# Patient Record
Sex: Female | Born: 1935 | Race: White | Hispanic: No | Marital: Married | Smoking: Never smoker
Health system: Southern US, Community
[De-identification: ages and names within clinical notes are randomized; demographics above are authoritative.]

## PROBLEM LIST (undated history)

## (undated) DIAGNOSIS — B019 Varicella without complication: Secondary | ICD-10-CM

## (undated) DIAGNOSIS — M8580 Other specified disorders of bone density and structure, unspecified site: Secondary | ICD-10-CM

## (undated) DIAGNOSIS — C73 Malignant neoplasm of thyroid gland: Secondary | ICD-10-CM

## (undated) DIAGNOSIS — E785 Hyperlipidemia, unspecified: Secondary | ICD-10-CM

## (undated) DIAGNOSIS — E039 Hypothyroidism, unspecified: Secondary | ICD-10-CM

## (undated) DIAGNOSIS — I10 Essential (primary) hypertension: Secondary | ICD-10-CM

## (undated) HISTORY — PX: DILATION AND CURETTAGE OF UTERUS: SHX78

## (undated) HISTORY — PX: KNEE CARTILAGE SURGERY: SHX688

## (undated) HISTORY — PX: HALLUX VALGUS AUSTIN: SHX6623

## (undated) HISTORY — PX: EYE SURGERY: SHX253

## (undated) HISTORY — PX: CARPAL TUNNEL RELEASE: SHX101

## (undated) HISTORY — PX: CATARACT EXTRACTION: SUR2

---

## 2008-04-04 ENCOUNTER — Ambulatory Visit: Payer: Self-pay | Admitting: Internal Medicine

## 2008-04-04 DIAGNOSIS — E782 Mixed hyperlipidemia: Secondary | ICD-10-CM | POA: Insufficient documentation

## 2008-04-04 DIAGNOSIS — E039 Hypothyroidism, unspecified: Secondary | ICD-10-CM | POA: Insufficient documentation

## 2008-04-04 DIAGNOSIS — I10 Essential (primary) hypertension: Secondary | ICD-10-CM | POA: Insufficient documentation

## 2008-04-04 DIAGNOSIS — E785 Hyperlipidemia, unspecified: Secondary | ICD-10-CM

## 2008-04-04 DIAGNOSIS — IMO0002 Reserved for concepts with insufficient information to code with codable children: Secondary | ICD-10-CM | POA: Insufficient documentation

## 2008-04-05 LAB — CONVERTED CEMR LAB
Alkaline Phosphatase: 54 units/L (ref 39–117)
Bilirubin, Direct: 0.2 mg/dL (ref 0.0–0.3)
CO2: 32 meq/L (ref 19–32)
Calcium: 9.3 mg/dL (ref 8.4–10.5)
Cholesterol: 188 mg/dL (ref 0–200)
HDL: 31.5 mg/dL — ABNORMAL LOW (ref >39.0)
Lymphocytes Relative: 26.6 % (ref 12.0–46.0)
MCHC: 34 g/dL (ref 30.0–36.0)
MCV: 99.3 fL (ref 78.0–100.0)
Monocytes Absolute: 0.6 10*3/uL (ref 0.1–1.0)
Neutro Abs: 3.9 10*3/uL (ref 1.4–7.7)
Neutrophils Relative %: 61.3 % (ref 43.0–77.0)
Platelets: 201 10*3/uL (ref 150–400)
Potassium: 4.5 meq/L (ref 3.5–5.1)
RDW: 12.9 % (ref 11.5–14.6)
TSH: 1.93 microintl units/mL (ref 0.35–5.50)
Total Bilirubin: 1 mg/dL (ref 0.3–1.2)
Total CHOL/HDL Ratio: 6
Triglycerides: 255 mg/dL (ref 0–149)
WBC: 6.2 10*3/uL (ref 4.5–10.5)

## 2008-04-11 ENCOUNTER — Ambulatory Visit: Payer: Self-pay | Admitting: Family Medicine

## 2008-04-11 DIAGNOSIS — M25569 Pain in unspecified knee: Secondary | ICD-10-CM

## 2008-04-11 DIAGNOSIS — M1710 Unilateral primary osteoarthritis, unspecified knee: Secondary | ICD-10-CM | POA: Insufficient documentation

## 2008-04-15 ENCOUNTER — Telehealth (INDEPENDENT_AMBULATORY_CARE_PROVIDER_SITE_OTHER): Payer: Self-pay | Admitting: *Deleted

## 2008-04-22 ENCOUNTER — Ambulatory Visit: Payer: Self-pay | Admitting: Family Medicine

## 2008-04-22 DIAGNOSIS — M2390 Unspecified internal derangement of unspecified knee: Secondary | ICD-10-CM | POA: Insufficient documentation

## 2008-04-26 ENCOUNTER — Encounter: Admission: RE | Admit: 2008-04-26 | Discharge: 2008-04-26 | Payer: Self-pay | Admitting: Family Medicine

## 2008-05-01 ENCOUNTER — Encounter: Payer: Self-pay | Admitting: Family Medicine

## 2008-05-29 ENCOUNTER — Encounter: Payer: Self-pay | Admitting: Internal Medicine

## 2008-06-26 ENCOUNTER — Encounter: Payer: Self-pay | Admitting: Family Medicine

## 2008-10-30 ENCOUNTER — Ambulatory Visit: Payer: Self-pay | Admitting: Internal Medicine

## 2008-10-30 DIAGNOSIS — M1990 Unspecified osteoarthritis, unspecified site: Secondary | ICD-10-CM | POA: Insufficient documentation

## 2008-10-30 DIAGNOSIS — R21 Rash and other nonspecific skin eruption: Secondary | ICD-10-CM

## 2008-10-30 DIAGNOSIS — L570 Actinic keratosis: Secondary | ICD-10-CM

## 2008-11-21 ENCOUNTER — Ambulatory Visit: Payer: Self-pay | Admitting: Internal Medicine

## 2008-11-21 DIAGNOSIS — N390 Urinary tract infection, site not specified: Secondary | ICD-10-CM

## 2008-11-21 LAB — CONVERTED CEMR LAB
Bilirubin Urine: NEGATIVE
Glucose, Urine, Semiquant: NEGATIVE
Nitrite: NEGATIVE
Urobilinogen, UA: 0.2

## 2008-11-25 ENCOUNTER — Telehealth: Payer: Self-pay | Admitting: Internal Medicine

## 2009-01-24 ENCOUNTER — Telehealth: Payer: Self-pay | Admitting: Family Medicine

## 2009-05-02 ENCOUNTER — Ambulatory Visit: Payer: Self-pay | Admitting: Internal Medicine

## 2009-05-07 LAB — CONVERTED CEMR LAB
ALT: 20 units/L (ref 0–35)
AST: 24 units/L (ref 0–37)
Albumin: 4 g/dL (ref 3.5–5.2)
BUN: 13 mg/dL (ref 6–23)
Basophils Absolute: 0 10*3/uL (ref 0.0–0.1)
Calcium: 9.4 mg/dL (ref 8.4–10.5)
Chloride: 104 meq/L (ref 96–112)
Cholesterol: 207 mg/dL — ABNORMAL HIGH (ref 0–200)
Creatinine, Ser: 0.9 mg/dL (ref 0.4–1.2)
HCT: 43.7 % (ref 36.0–46.0)
Hemoglobin: 14.8 g/dL (ref 12.0–15.0)
Lymphocytes Relative: 29 % (ref 12.0–46.0)
MCV: 100.7 fL — ABNORMAL HIGH (ref 78.0–100.0)
Monocytes Relative: 11.2 % (ref 3.0–12.0)
Neutro Abs: 3.4 10*3/uL (ref 1.4–7.7)
Neutrophils Relative %: 58.6 % (ref 43.0–77.0)
Phosphorus: 3.8 mg/dL (ref 2.3–4.6)
Platelets: 169 10*3/uL (ref 150.0–400.0)
Potassium: 4.4 meq/L (ref 3.5–5.1)
RBC: 4.34 M/uL (ref 3.87–5.11)
Sodium: 138 meq/L (ref 135–145)
Total Bilirubin: 1.2 mg/dL (ref 0.3–1.2)
Total Protein: 6.4 g/dL (ref 6.0–8.3)
Triglycerides: 139 mg/dL (ref 0.0–149.0)
WBC: 5.8 10*3/uL (ref 4.5–10.5)

## 2012-01-17 ENCOUNTER — Ambulatory Visit: Payer: Self-pay | Admitting: Anesthesiology

## 2012-01-20 ENCOUNTER — Ambulatory Visit: Payer: Self-pay | Admitting: Podiatry

## 2013-11-15 ENCOUNTER — Ambulatory Visit: Payer: Self-pay | Admitting: Family Medicine

## 2014-03-25 DIAGNOSIS — M25562 Pain in left knee: Secondary | ICD-10-CM | POA: Insufficient documentation

## 2015-05-19 ENCOUNTER — Encounter
Admission: RE | Admit: 2015-05-19 | Discharge: 2015-05-19 | Disposition: A | Discharge status: Home / Self Care | Payer: Medicare Other | Source: Ambulatory Visit | Attending: Surgery | Admitting: Surgery

## 2015-05-19 ENCOUNTER — Other Ambulatory Visit: Payer: Self-pay

## 2015-05-19 DIAGNOSIS — Z01818 Encounter for other preprocedural examination: Secondary | ICD-10-CM | POA: Insufficient documentation

## 2015-05-19 DIAGNOSIS — K429 Umbilical hernia without obstruction or gangrene: Secondary | ICD-10-CM | POA: Diagnosis not present

## 2015-05-19 HISTORY — DX: Varicella without complication: B01.9

## 2015-05-19 HISTORY — DX: Hyperlipidemia, unspecified: E78.5

## 2015-05-19 HISTORY — DX: Hypothyroidism, unspecified: E03.9

## 2015-05-19 HISTORY — DX: Essential (primary) hypertension: I10

## 2015-05-19 HISTORY — DX: Other specified disorders of bone density and structure, unspecified site: M85.80

## 2015-05-19 LAB — CBC
HEMATOCRIT: 43.5 % (ref 35.0–47.0)
Hemoglobin: 14.4 g/dL (ref 12.0–16.0)
MCH: 32.4 pg (ref 26.0–34.0)
MCHC: 33 g/dL (ref 32.0–36.0)
MCV: 98.3 fL (ref 80.0–100.0)
PLATELETS: 174 10*3/uL (ref 150–440)
RBC: 4.43 MIL/uL (ref 3.80–5.20)
RDW: 13.8 % (ref 11.5–14.5)
WBC: 5.5 10*3/uL (ref 3.6–11.0)

## 2015-05-19 NOTE — Patient Instructions (Signed)
  Your procedure is scheduled on: Friday 05/23/15 Report to Day Surgery. 2ND FLOOR MEDICAL MALL ENTRANCE To find out your arrival time please call [phone] between 1PM - 3PM on Thursday 05/22/15.  Remember: Instructions that are not followed completely may result in serious medical risk, up to and including death, or upon the discretion of your surgeon and anesthesiologist your surgery may need to be rescheduled.    __X__ 1. Do not eat food or drink liquids after midnight. No gum chewing or hard candies.     __X__ 2. No Alcohol for 24 hours before or after surgery.   ____ 3. Bring all medications with you on the day of surgery if instructed.    __X__ 4. Notify your doctor if there is any change in your medical condition     (cold, fever, infections).     Do not wear jewelry, make-up, hairpins, clips or nail polish.  Do not wear lotions, powders, or perfumes.   Do not shave 48 hours prior to surgery. Men may shave face and neck.  Do not bring valuables to the hospital.    [facility] is not responsible for any belongings or valuables.               Contacts, dentures or bridgework may not be worn into surgery.  Leave your suitcase in the car. After surgery it may be brought to your room.  For patients admitted to the hospital, discharge time is determined by your                treatment team.   Patients discharged the day of surgery will not be allowed to drive home.   Please read over the following fact sheets that you were given:   Surgical Site Infection Prevention   __X__ Take these medicines the morning of surgery with A SIP OF WATER:    1. LEVOTHYROXINE  2.   3.   4.  5.  6.  ____ Fleet Enema (as directed)   __X__ Use CHG Soap as directed  ____ Use inhalers on the day of surgery  ____ Stop metformin 2 days prior to surgery    ____ Take 1/2 of usual insulin dose the night before surgery and none on the morning of surgery.   ____ Stop Coumadin/Plavix/aspirin  on   __X__ Stop Anti-inflammatories on  TODAY   ____ Stop supplements until after surgery.    ____ Bring C-Pap to the hospital.

## 2015-05-20 NOTE — OR Nursing (Signed)
Spoke to Dr Kayleen Memos about ekg

## 2015-05-23 ENCOUNTER — Encounter
Admission: RE | Disposition: A | Discharge status: Home / Self Care | Payer: Self-pay | Source: Ambulatory Visit | Attending: Surgery

## 2015-05-23 ENCOUNTER — Ambulatory Visit
Admission: RE | Admit: 2015-05-23 | Discharge: 2015-05-23 | Disposition: A | Discharge status: Home / Self Care | Payer: Medicare Other | Source: Ambulatory Visit | Attending: Surgery | Admitting: Surgery

## 2015-05-23 ENCOUNTER — Ambulatory Visit: Payer: Medicare Other | Admitting: Certified Registered Nurse Anesthetist

## 2015-05-23 ENCOUNTER — Encounter: Payer: Self-pay | Admitting: *Deleted

## 2015-05-23 DIAGNOSIS — E039 Hypothyroidism, unspecified: Secondary | ICD-10-CM | POA: Insufficient documentation

## 2015-05-23 DIAGNOSIS — K429 Umbilical hernia without obstruction or gangrene: Secondary | ICD-10-CM | POA: Insufficient documentation

## 2015-05-23 DIAGNOSIS — Z9841 Cataract extraction status, right eye: Secondary | ICD-10-CM | POA: Diagnosis not present

## 2015-05-23 DIAGNOSIS — M1990 Unspecified osteoarthritis, unspecified site: Secondary | ICD-10-CM | POA: Diagnosis not present

## 2015-05-23 DIAGNOSIS — E785 Hyperlipidemia, unspecified: Secondary | ICD-10-CM | POA: Insufficient documentation

## 2015-05-23 DIAGNOSIS — Z79899 Other long term (current) drug therapy: Secondary | ICD-10-CM | POA: Diagnosis not present

## 2015-05-23 DIAGNOSIS — Z9842 Cataract extraction status, left eye: Secondary | ICD-10-CM | POA: Insufficient documentation

## 2015-05-23 DIAGNOSIS — M8580 Other specified disorders of bone density and structure, unspecified site: Secondary | ICD-10-CM | POA: Insufficient documentation

## 2015-05-23 DIAGNOSIS — I440 Atrioventricular block, first degree: Secondary | ICD-10-CM | POA: Insufficient documentation

## 2015-05-23 DIAGNOSIS — R0602 Shortness of breath: Secondary | ICD-10-CM | POA: Insufficient documentation

## 2015-05-23 DIAGNOSIS — I10 Essential (primary) hypertension: Secondary | ICD-10-CM | POA: Diagnosis not present

## 2015-05-23 HISTORY — PX: UMBILICAL HERNIA REPAIR: SHX196

## 2015-05-23 SURGERY — REPAIR, HERNIA, UMBILICAL, ADULT
Anesthesia: General

## 2015-05-23 MED ORDER — LIDOCAINE HCL (CARDIAC) 20 MG/ML IV SOLN
INTRAVENOUS | Status: DC | PRN
  Administered 2015-05-23: 100 mg via INTRAVENOUS

## 2015-05-23 MED ORDER — LACTATED RINGERS IV SOLN
INTRAVENOUS | Status: DC
  Administered 2015-05-23 (×2): via INTRAVENOUS

## 2015-05-23 MED ORDER — FAMOTIDINE 20 MG PO TABS
20.0000 mg | ORAL_TABLET | Freq: Once | ORAL | Status: AC
  Administered 2015-05-23: 20 mg via ORAL

## 2015-05-23 MED ORDER — SUCCINYLCHOLINE CHLORIDE 20 MG/ML IJ SOLN
INTRAMUSCULAR | Status: DC | PRN
  Administered 2015-05-23: 140 mg via INTRAVENOUS

## 2015-05-23 MED ORDER — GLYCOPYRROLATE 0.2 MG/ML IJ SOLN
INTRAMUSCULAR | Status: DC | PRN
  Administered 2015-05-23: 0.4 mg via INTRAVENOUS

## 2015-05-23 MED ORDER — ROCURONIUM BROMIDE 100 MG/10ML IV SOLN
INTRAVENOUS | Status: DC | PRN
  Administered 2015-05-23: 15 mg via INTRAVENOUS
  Administered 2015-05-23: 5 mg via INTRAVENOUS

## 2015-05-23 MED ORDER — HYDROCODONE-ACETAMINOPHEN 5-325 MG PO TABS: 1.0000 | ORAL_TABLET | ORAL | Status: DC | PRN

## 2015-05-23 MED ORDER — ACETAMINOPHEN 10 MG/ML IV SOLN
INTRAVENOUS | Status: AC
  Filled 2015-05-23: qty 100

## 2015-05-23 MED ORDER — NEOSTIGMINE METHYLSULFATE 10 MG/10ML IV SOLN
INTRAVENOUS | Status: DC | PRN
  Administered 2015-05-23: 3 mg via INTRAVENOUS

## 2015-05-23 MED ORDER — MIDAZOLAM HCL 2 MG/2ML IJ SOLN
INTRAMUSCULAR | Status: DC | PRN
  Administered 2015-05-23 (×2): 1 mg via INTRAVENOUS

## 2015-05-23 MED ORDER — PROPOFOL 10 MG/ML IV BOLUS
INTRAVENOUS | Status: DC | PRN
  Administered 2015-05-23: 150 mg via INTRAVENOUS

## 2015-05-23 MED ORDER — BUPIVACAINE-EPINEPHRINE (PF) 0.5% -1:200000 IJ SOLN
INTRAMUSCULAR | Status: AC
  Filled 2015-05-23: qty 30

## 2015-05-23 MED ORDER — EPHEDRINE SULFATE 50 MG/ML IJ SOLN
INTRAMUSCULAR | Status: DC | PRN
  Administered 2015-05-23: 10 mg via INTRAVENOUS

## 2015-05-23 MED ORDER — CEFAZOLIN SODIUM-DEXTROSE 2-3 GM-% IV SOLR: 2.0000 g | Freq: Once | INTRAVENOUS | Status: DC

## 2015-05-23 MED ORDER — FENTANYL CITRATE (PF) 100 MCG/2ML IJ SOLN
INTRAMUSCULAR | Status: DC | PRN
  Administered 2015-05-23 (×2): 50 ug via INTRAVENOUS

## 2015-05-23 MED ORDER — ACETAMINOPHEN 10 MG/ML IV SOLN
INTRAVENOUS | Status: DC | PRN
  Administered 2015-05-23: 1000 mg via INTRAVENOUS

## 2015-05-23 MED ORDER — CEFAZOLIN SODIUM-DEXTROSE 2-3 GM-% IV SOLR
INTRAVENOUS | Status: AC
  Filled 2015-05-23: qty 50

## 2015-05-23 MED ORDER — FAMOTIDINE 20 MG PO TABS
ORAL_TABLET | ORAL | Status: AC
  Administered 2015-05-23: 20 mg via ORAL
  Filled 2015-05-23: qty 1

## 2015-05-23 MED ORDER — KETOROLAC TROMETHAMINE 30 MG/ML IJ SOLN
INTRAMUSCULAR | Status: DC | PRN
  Administered 2015-05-23: 15 mg via INTRAVENOUS

## 2015-05-23 MED ORDER — BUPIVACAINE-EPINEPHRINE (PF) 0.5% -1:200000 IJ SOLN
INTRAMUSCULAR | Status: DC | PRN
  Administered 2015-05-23: 10 mL

## 2015-05-23 SURGICAL SUPPLY — 23 items
BLADE SURG 15 STRL LF DISP TIS (BLADE) ×1 IMPLANT
BLADE SURG 15 STRL SS (BLADE) ×2
CANISTER SUCT 1200ML W/VALVE (MISCELLANEOUS) ×3 IMPLANT
CHLORAPREP W/TINT 26ML (MISCELLANEOUS) ×3 IMPLANT
DRAPE PED LAPAROTOMY (DRAPES) ×3 IMPLANT
GLOVE BIO SURGEON STRL SZ7.5 (GLOVE) ×3 IMPLANT
GOWN STRL REUS W/ TWL LRG LVL3 (GOWN DISPOSABLE) ×3 IMPLANT
GOWN STRL REUS W/TWL LRG LVL3 (GOWN DISPOSABLE) ×6
KIT RM TURNOVER STRD PROC AR (KITS) ×3 IMPLANT
LABEL OR SOLS (LABEL) ×3 IMPLANT
LIQUID BAND (GAUZE/BANDAGES/DRESSINGS) ×3 IMPLANT
MESH SYNTHETIC 4X6 SOFT BARD (Mesh General) ×1 IMPLANT
MESH SYNTHETIC SOFT BARD 4X6 (Mesh General) ×2 IMPLANT
NEEDLE HYPO 25X1 1.5 SAFETY (NEEDLE) ×3 IMPLANT
NS IRRIG 500ML POUR BTL (IV SOLUTION) ×3 IMPLANT
PACK BASIN MINOR ARMC (MISCELLANEOUS) ×3 IMPLANT
PAD GROUND ADULT SPLIT (MISCELLANEOUS) ×3 IMPLANT
SUT CHROMIC 3 0 SH 27 (SUTURE) ×3 IMPLANT
SUT CHROMIC 4 0 RB 1X27 (SUTURE) ×3 IMPLANT
SUT MNCRL+ 5-0 UNDYED PC-3 (SUTURE) ×1 IMPLANT
SUT MONOCRYL 5-0 (SUTURE) ×2
SUT SURGILON 0 30 BLK (SUTURE) ×6 IMPLANT
SYRINGE 10CC LL (SYRINGE) ×3 IMPLANT

## 2015-05-23 NOTE — H&P (Signed)
  She reports no change in condition since office visit.   Discussed plan for umbilical hernia repair.

## 2015-05-23 NOTE — Anesthesia Preprocedure Evaluation (Signed)
Anesthesia Evaluation  Patient identified by MRN, date of birth, ID band Patient awake    Reviewed: Allergy & Precautions, H&P , NPO status , Patient's Chart, lab work & pertinent test results  History of Anesthesia Complications Negative for: history of anesthetic complications  Airway Mallampati: III  TM Distance: <3 FB Neck ROM: limited    Dental  (+) Poor Dentition, Chipped   Pulmonary neg pulmonary ROS, neg shortness of breath,    Pulmonary exam normal breath sounds clear to auscultation       Cardiovascular Exercise Tolerance: Good hypertension, (-) angina(-) Past MI and (-) DOE Normal cardiovascular exam Rhythm:regular Rate:Normal     Neuro/Psych negative neurological ROS  negative psych ROS   GI/Hepatic negative GI ROS, Neg liver ROS,   Endo/Other  Hypothyroidism   Renal/GU negative Renal ROS  negative genitourinary   Musculoskeletal  (+) Arthritis ,   Abdominal   Peds  Hematology negative hematology ROS (+)   Anesthesia Other Findings Past Medical History:   Hypertension                                                 Chicken pox                                                  Hyperlipidemia                                               Hypothyroidism                                               Osteopenia                                                  Past Surgical History:   EYE SURGERY                                                   CATARACT EXTRACTION                             Bilateral              CARPAL TUNNEL RELEASE                           Right              HALLUX VALGUS [place]  KNEE CARTILAGE SURGERY                          Right              DILATION AND CURETTAGE OF UTERUS                                Comment:for post meopausal bleeding  BMI    Body Mass Index   32.70 kg/m 2      Reproductive/Obstetrics negative OB ROS                              Anesthesia Physical Anesthesia Plan  ASA: III  Anesthesia Plan: General ETT   Post-op Pain Management:    Induction:   Airway Management Planned:   Additional Equipment:   Intra-op Plan:   Post-operative Plan:   Informed Consent: I have reviewed the patients History and Physical, chart, labs and discussed the procedure including the risks, benefits and alternatives for the proposed anesthesia with the patient or authorized representative who has indicated his/her understanding and acceptance.   Dental Advisory Given  Plan Discussed with: Anesthesiologist, CRNA and Surgeon  Anesthesia Plan Comments:         Anesthesia Quick Evaluation

## 2015-05-23 NOTE — Transfer of Care (Signed)
Immediate Anesthesia Transfer of Care Note  Patient: Yolanda Wade  Procedure(s) Performed: Procedure(s): HERNIA REPAIR UMBILICAL ADULT (N/A)  Patient Location: PACU  Anesthesia Type:General  Level of Consciousness: awake and alert   Airway & Oxygen Therapy: Patient Spontanous Breathing and Patient connected to nasal cannula oxygen  Post-op Assessment: Report given to RN and Post -op Vital signs reviewed and stable  Post vital signs: Reviewed and stable  Last Vitals:  Filed Vitals:   05/23/15 1148  BP: 152/93  Pulse: 80  Temp: 36.7 C  Resp: 18    Complications: No apparent anesthesia complications

## 2015-05-23 NOTE — Anesthesia Postprocedure Evaluation (Signed)
  Anesthesia Post-op Note  Patient: Yolanda Wade  Procedure(s) Performed: Procedure(s): HERNIA REPAIR UMBILICAL ADULT (N/A)  Anesthesia type:General ETT  Patient location: PACU  Post pain: Pain level controlled  Post assessment: Post-op Vital signs reviewed, Patient's Cardiovascular Status Stable, Respiratory Function Stable, Patent Airway and No signs of Nausea or vomiting  Post vital signs: Reviewed and stable  Last Vitals:  Filed Vitals:   05/23/15 1524  BP: 155/71  Pulse: 57  Temp:   Resp: 20    Level of consciousness: awake, alert  and patient cooperative  Complications: No apparent anesthesia complications

## 2015-05-23 NOTE — Discharge Instructions (Signed)
Take Tylenol or Norco if needed for pain.  May shower.  Avoid straining and heavy lifting for 1 month.

## 2015-05-23 NOTE — Op Note (Signed)
OPERATIVE REPORT  PREOPERATIVE  DIAGNOSIS: . Umbilical hernia  POSTOPERATIVE DIAGNOSIS: . Umbilical hernia  PROCEDURE: . Umbilical hernia repair  ANESTHESIA:  General  SURGEON: Rochel Brome  MD   INDICATIONS: . She reported an enlarging umbilical hernia. Umbilical hernia was demonstrated on physical exam the prostate 5 cm in dimension and appeared to be large enough to admit small bowel. Repair is recommended for definitive treatment.  With the patient on the operating table in the supine position the abdomen was prepared with ChloraPrep and draped in a sterile manner. An infraumbilical transversely oriented curvilinear 3 cm incision was made and carried down through subcutaneous tissues. Several small bleeding points are cauterized. There was an umbilical hernia sac which was dissected free from surrounding structures and was approximately 5 cm in length. A high ligation of the sac was done with the 4-0 chromic suture ligature and the sac was excised and was not submitted for pathology. The properitoneal fat was dissected away from the fascia circumferentially. A Bard soft mesh was cut out to create an oval shape of 3 x 4 cm which was placed transversely oriented into this properitoneal plane. It was sutured to the overlying fascia with 0 Surgilon sutures. The fascial defect was closed with a transversely oriented suture line of interrupted 0 Surgilon figure-of-eight sutures incorporating each suture into the mesh. The repair looked good. Hemostasis was intact. The deep fascia and subcutaneous tissues were infiltrated with half percent Sensorcaine with epinephrine. The skin of the umbilicus was sutured to the deep fascia with 4-0 chromic. The skin was closed with running 5-0 Monocryl subcuticular suture and LiquiBand. The patient tolerated surgery satisfactorily and was prepared for transfer to the recovery room  [facility].D.

## 2015-05-23 NOTE — Anesthesia Procedure Notes (Signed)
Procedure Name: Intubation Date/Time: 05/23/2015 12:41 PM Performed by: Johnna Acosta Pre-anesthesia Checklist: Patient identified, Emergency Drugs available, Suction available and Patient being monitored Patient Re-evaluated:Patient Re-evaluated prior to inductionOxygen Delivery Method: Circle system utilized Preoxygenation: Pre-oxygenation with 100% oxygen Intubation Type: IV induction Ventilation: Oral airway inserted - appropriate to patient size and Mask ventilation without difficulty Laryngoscope Size: Glidescope and 3 Grade View: Grade II Tube type: Oral Tube size: 7.0 mm Number of attempts: 1

## 2015-05-26 ENCOUNTER — Encounter: Payer: Self-pay | Admitting: Surgery

## 2016-05-21 ENCOUNTER — Emergency Department
Admission: EM | Admit: 2016-05-21 | Discharge: 2016-05-21 | Disposition: A | Discharge status: Home / Self Care | Payer: Medicare Other | Attending: Emergency Medicine | Admitting: Emergency Medicine

## 2016-05-21 ENCOUNTER — Emergency Department: Payer: Medicare Other

## 2016-05-21 ENCOUNTER — Encounter: Payer: Self-pay | Admitting: Radiology

## 2016-05-21 DIAGNOSIS — Z791 Long term (current) use of non-steroidal anti-inflammatories (NSAID): Secondary | ICD-10-CM | POA: Diagnosis not present

## 2016-05-21 DIAGNOSIS — M545 Low back pain: Secondary | ICD-10-CM | POA: Insufficient documentation

## 2016-05-21 DIAGNOSIS — Y929 Unspecified place or not applicable: Secondary | ICD-10-CM | POA: Diagnosis not present

## 2016-05-21 DIAGNOSIS — W19XXXA Unspecified fall, initial encounter: Secondary | ICD-10-CM | POA: Diagnosis not present

## 2016-05-21 DIAGNOSIS — Y999 Unspecified external cause status: Secondary | ICD-10-CM | POA: Diagnosis not present

## 2016-05-21 DIAGNOSIS — K5903 Drug induced constipation: Secondary | ICD-10-CM | POA: Diagnosis not present

## 2016-05-21 DIAGNOSIS — Z79899 Other long term (current) drug therapy: Secondary | ICD-10-CM | POA: Insufficient documentation

## 2016-05-21 DIAGNOSIS — Y939 Activity, unspecified: Secondary | ICD-10-CM | POA: Insufficient documentation

## 2016-05-21 DIAGNOSIS — E039 Hypothyroidism, unspecified: Secondary | ICD-10-CM | POA: Insufficient documentation

## 2016-05-21 DIAGNOSIS — T402X5A Adverse effect of other opioids, initial encounter: Secondary | ICD-10-CM

## 2016-05-21 DIAGNOSIS — I10 Essential (primary) hypertension: Secondary | ICD-10-CM | POA: Insufficient documentation

## 2016-05-21 DIAGNOSIS — K5900 Constipation, unspecified: Secondary | ICD-10-CM | POA: Diagnosis present

## 2016-05-21 LAB — CBC WITH DIFFERENTIAL/PLATELET
BASOS ABS: 0 10*3/uL (ref 0–0.1)
BASOS PCT: 0 %
EOS ABS: 0 10*3/uL (ref 0–0.7)
EOS PCT: 1 %
HCT: 42.7 % (ref 35.0–47.0)
Hemoglobin: 14.9 g/dL (ref 12.0–16.0)
Lymphocytes Relative: 15 %
Lymphs Abs: 1.2 10*3/uL (ref 1.0–3.6)
MCH: 33.6 pg (ref 26.0–34.0)
MCHC: 34.9 g/dL (ref 32.0–36.0)
MCV: 96.3 fL (ref 80.0–100.0)
Monocytes Absolute: 0.8 10*3/uL (ref 0.2–0.9)
Monocytes Relative: 10 %
NEUTROS PCT: 74 %
Neutro Abs: 5.8 10*3/uL (ref 1.4–6.5)
PLATELETS: 193 10*3/uL (ref 150–440)
RBC: 4.43 MIL/uL (ref 3.80–5.20)
RDW: 13.7 % (ref 11.5–14.5)
WBC: 7.9 10*3/uL (ref 3.6–11.0)

## 2016-05-21 LAB — COMPREHENSIVE METABOLIC PANEL
ALBUMIN: 4.1 g/dL (ref 3.5–5.0)
ALT: 16 U/L (ref 14–54)
AST: 22 U/L (ref 15–41)
Alkaline Phosphatase: 76 U/L (ref 38–126)
Anion gap: 8 (ref 5–15)
BUN: 16 mg/dL (ref 6–20)
CHLORIDE: 97 mmol/L — AB (ref 101–111)
CO2: 27 mmol/L (ref 22–32)
CREATININE: 0.71 mg/dL (ref 0.44–1.00)
Calcium: 8.9 mg/dL (ref 8.9–10.3)
GFR calc non Af Amer: >60 mL/min (ref >60)
GLUCOSE: 100 mg/dL — AB (ref 65–99)
Potassium: 3.8 mmol/L (ref 3.5–5.1)
SODIUM: 132 mmol/L — AB (ref 135–145)
Total Bilirubin: 0.7 mg/dL (ref 0.3–1.2)
Total Protein: 7.3 g/dL (ref 6.5–8.1)

## 2016-05-21 LAB — LIPASE, BLOOD: Lipase: 27 U/L (ref 11–51)

## 2016-05-21 MED ORDER — ACETAMINOPHEN 500 MG PO TABS
1000.0000 mg | ORAL_TABLET | Freq: Once | ORAL | Status: AC
  Administered 2016-05-21: 1000 mg via ORAL
  Filled 2016-05-21: qty 2

## 2016-05-21 MED ORDER — MAGNESIUM CITRATE PO SOLN
1.0000 | Freq: Once | ORAL | Status: AC
  Administered 2016-05-21: 1 via ORAL
  Filled 2016-05-21: qty 296

## 2016-05-21 MED ORDER — LORAZEPAM 2 MG/ML IJ SOLN
0.5000 mg | Freq: Once | INTRAMUSCULAR | Status: AC
  Administered 2016-05-21: 0.5 mg via INTRAVENOUS
  Filled 2016-05-21: qty 1

## 2016-05-21 MED ORDER — MAGNESIUM CITRATE PO SOLN
0.5000 | ORAL | 0 refills | Status: DC | PRN
Start: 2016-05-21 — End: 2016-05-31

## 2016-05-21 MED ORDER — IOPAMIDOL (ISOVUE-300) INJECTION 61%
100.0000 mL | Freq: Once | INTRAVENOUS | Status: AC | PRN
  Administered 2016-05-21: 100 mL via INTRAVENOUS

## 2016-05-21 MED ORDER — IOPAMIDOL (ISOVUE-300) INJECTION 61%
30.0000 mL | Freq: Once | INTRAVENOUS | Status: AC | PRN
  Administered 2016-05-21: 30 mL via ORAL

## 2016-05-21 MED ORDER — POLYETHYLENE GLYCOL 3350 17 GM/SCOOP PO POWD: 1.0000 | Freq: Once | ORAL | 0 refills | Status: AC

## 2016-05-21 NOTE — ED Provider Notes (Signed)
[facility] Emergency Department Provider Note    ____________________________________________   I have reviewed the triage vital signs and the nursing notes.   HISTORY  Chief Complaint Constipation   History limited by: Not Limited   HPI Yolanda Wade is a 82 y.o. female who presents to the emergency department today because of concern for constipation and back pain. It started roughly 2 weeks ago. The patient states she had a fall at that time and started having back pain. Saw PCP who put her on pain medication. Since that time she has not had a good bowel movement, only had a small amount roughly 1 week ago. Has been taking miralax and fiber without any relief. Patient denies any pressure or pain in the rectum. Denies the sensation that she needs to defecate. Denies any fever. Had some vomiting yesterday.   Past Medical History:  Diagnosis Date  . Chicken pox   . Hyperlipidemia   . Hypertension   . Hypothyroidism   . Osteopenia     Patient Active Problem List   Diagnosis Date Noted  . UTI 11/21/2008  . ACTINIC KERATOSIS 10/30/2008  . OSTEOARTHRITIS 10/30/2008  . RASH AND OTHER NONSPECIFIC SKIN ERUPTION 10/30/2008  . INTERNAL DERANGEMENT, RIGHT KNEE 04/22/2008  . 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 KNEE PAIN, RIGHT, ACUTE 04/11/2008  . HYPOTHYROIDISM 04/04/2008  . HYPERLIPIDEMIA 04/04/2008  . HYPERTENSION 04/04/2008  . KNEE SPRAIN 04/04/2008    Past Surgical History:  Procedure Laterality Date  . CARPAL TUNNEL RELEASE Right   . CATARACT EXTRACTION Bilateral   . DILATION AND CURETTAGE OF UTERUS     for post meopausal bleeding  . EYE SURGERY    . HALLUX [place] Right   . UMBILICAL HERNIA REPAIR N/A 05/23/2015   Procedure: HERNIA REPAIR UMBILICAL ADULT;  Surgeon: Leonie Green, MD;  Location: [facility] ORS;  Service: General;  Laterality: N/A;    Prior to Admission medications    Medication Sig Start Date End Date Taking? Authorizing Provider  atorvastatin (LIPITOR) 20 MG tablet Take 20 mg by mouth daily.    Historical Provider, MD  Calcium-Vitamin D-Vitamin K (VIACTIV PO) Take 2 Doses by mouth daily.    Historical Provider, MD  HYDROcodone-acetaminophen (NORCO) 5-325 MG tablet Take 1-2 tablets by mouth every 4 (four) hours as needed for moderate pain. 05/23/15   Leonie Green, MD  levothyroxine (SYNTHROID, LEVOTHROID) 75 MCG tablet Take 75 mcg by mouth daily before breakfast.    Historical Provider, MD  lisinopril-hydrochlorothiazide (PRINZIDE,ZESTORETIC) 10-12.5 MG tablet Take 1 tablet by mouth daily.    Historical Provider, MD  Multiple Vitamins-Minerals (PRESERVISION AREDS PO) Take 1 tablet by mouth 2 (two) times daily.    Historical Provider, MD    Allergies Review of patient's allergies indicates no known allergies.  No family history on file.  Social History Social History  Substance Use Topics  . Smoking status: Never Smoker  . Smokeless tobacco: Never Used  . Alcohol use Yes     Comment: occasionally    Review of Systems  Constitutional: Negative for fever. Cardiovascular: Negative for chest pain. Respiratory: Negative for shortness of breath. Gastrointestinal: Positive for distention, vomiting. Genitourinary: Negative for dysuria. Musculoskeletal: Positive for low back pain. Skin: Negative for rash. Neurological: Negative for headaches, focal weakness or numbness.  10-point ROS otherwise negative.  ____________________________________________   PHYSICAL EXAM:  VITAL SIGNS: ED Triage Vitals  Enc Vitals Group  BP 05/21/16 0512 (!) 182/86     Pulse Rate 05/21/16 0512 76     Resp 05/21/16 0512 20     Temp 05/21/16 0512 98.6 F (37 C)     Temp Source 05/21/16 0512 Oral     SpO2 05/21/16 0512 96 %     Weight 05/21/16 0514 173 lb (78.5 kg)     Height 05/21/16 0514 5\' 1"  (1.549 m)     Head Circumference --      Peak Flow --       Pain Score 05/21/16 0514 6   Constitutional: Alert and oriented. Well appearing and in no distress. Eyes: Conjunctivae are normal. Normal extraocular movements. ENT   Head: Normocephalic and atraumatic.   Nose: No congestion/rhinnorhea.   Mouth/Throat: Mucous membranes are moist.   Neck: No stridor. Hematological/Lymphatic/Immunilogical: No cervical lymphadenopathy. Cardiovascular: Normal rate, regular rhythm.  No murmurs, rubs, or gallops.  Respiratory: Normal respiratory effort without tachypnea nor retractions. Breath sounds are clear and equal bilaterally. No wheezes/rales/rhonchi. Gastrointestinal: Soft. Tender to palpation somewhat diffusely. Distended abdomen. Tympanitic.  Genitourinary: Deferred Musculoskeletal: Normal range of motion in all extremities. No lower extremity edema. Neurologic:  Normal speech and language. No gross focal neurologic deficits are appreciated.  Skin:  Skin is warm, dry and intact. No rash noted. Psychiatric: Mood and affect are normal. Speech and behavior are normal. Patient exhibits appropriate insight and judgment.  ____________________________________________    LABS (pertinent positives/negatives)  Labs Reviewed  COMPREHENSIVE METABOLIC PANEL - Abnormal; Notable for the following:       Result Value   Sodium 132 (*)    Chloride 97 (*)    Glucose, Bld 100 (*)    All other components within normal limits  CBC WITH DIFFERENTIAL/PLATELET  LIPASE, BLOOD  URINALYSIS COMPLETEWITH MICROSCOPIC ([facility] ONLY)   UA pending  ____________________________________________   EKG  None  ____________________________________________    RADIOLOGY  CT abd/pel pending  ____________________________________________   PROCEDURES  Procedures  ____________________________________________   INITIAL IMPRESSION / ASSESSMENT AND PLAN / ED COURSE  Pertinent labs & imaging results that were available during my care of the patient  were reviewed by me and considered in my medical decision making (see chart for details).  Patient with primary concern for constipation. On exam patient's abdominal exam is concerning for some distention and tympany. Will plan on CT abd/pel to evaluate for possible obstruction. ____________________________________________   FINAL CLINICAL IMPRESSION(S) / ED DIAGNOSES  Back pain Decreased bowel movement  Note: This dictation was prepared with Dragon dictation. Any transcriptional errors that result from this process are unintentional    Nance Pear, MD 05/21/16 [phone]

## 2016-05-21 NOTE — ED Notes (Signed)
Patient given water

## 2016-05-21 NOTE — ED Provider Notes (Signed)
Patient received in signout from Dr. Archie Balboa. Patient presenting with constipation and back pain. Has been taking narcotics for the past several weeks and not moving her bowels. CT imaging pending to evaluate for any evidence of obstruction shows large stool burden without evidence of obstructive process. Does have evidence of chronic compression fractures. Patient without any focal neurodeficits. Does not have any evidence of fecal impaction. Symptoms likely secondary to narcotic abuse and slow transit time. Patient currently taking MiraLax. Will add on mag citrate and increase MiraLAX.   Have discussed with the patient and available family all diagnostics and treatments performed thus far and all questions were answered to the best of my ability. The patient demonstrates understanding and agreement with plan.    Merlyn Lot, MD 05/21/16 [phone]

## 2016-05-21 NOTE — ED Notes (Signed)
2 unsuccessful PIV attempts by this RN (left hand and left forearm).

## 2016-05-21 NOTE — ED Triage Notes (Signed)
Patient presents to the ED tonight with c/o constipation. LNBM was over 2 weeks ago. Patient reports that she has been seen by PCP and [facility]; is currently taking Miralax. Patient states, "It is a shame that you go for help and no on wants to help you. Everyone wants to take the slow road to fixing this". Patient reports that she has been taking pain medication for sciatica.

## 2016-05-21 NOTE — Discharge Instructions (Signed)
CLINICAL DATA:  Constipation for 2 weeks. Abdominal distention and back pain.   EXAM: CT ABDOMEN AND PELVIS WITH CONTRAST   TECHNIQUE: Multidetector CT imaging of the abdomen and pelvis was performed using the standard protocol following bolus administration of intravenous contrast.   CONTRAST:  160mL ISOVUE-300 IOPAMIDOL (ISOVUE-300) INJECTION 61%   COMPARISON:  None.   FINDINGS: Lower chest:  No contributory findings.   Hepatobiliary: No focal liver abnormality.No evidence of biliary obstruction or stone.   Pancreas: Unremarkable.   Spleen: Unremarkable.   Adrenals/Urinary Tract: Negative adrenals. No hydronephrosis or stone. Renal cysts. Unremarkable bladder.   Stomach/Bowel: Formed stool throughout the colon. No rectal impaction or obstruction. Mild colonic diverticulosis. No pericecal inflammation.   Vascular/Lymphatic: No acute vascular abnormality. No mass or adenopathy.   Reproductive:11 cm uterine mass that is heterogeneously enhancing. Negative ovaries.   Other: No ascites or pneumoperitoneum.   Musculoskeletal: No acute abnormalities. T12 compression fracture with mild height loss. Fracture lucency is partly visible, overall subacute to chronic appearance. Remote L1 compression fracture with moderate height loss. Advanced facet arthropathy in the lower lumbar spine with grade 1 L5-S1 anterolisthesis.   IMPRESSION: 1. Large volume stool without obstruction or impaction. 2. 11 cm uterine mass has features of fibroid, but is unexpected large and enhancing for patient's age. Reportedly there is history of biopsy for postmenopausal bleeding. Recommend gynecologic follow-up for correlation with previous sonography. 3. Subacute to chronic T12 compression fracture. Chronic L1 compression fracture.

## 2016-05-23 ENCOUNTER — Emergency Department
Admission: EM | Admit: 2016-05-23 | Discharge: 2016-05-23 | Disposition: A | Discharge status: Home / Self Care | Payer: Medicare Other | Attending: Emergency Medicine | Admitting: Emergency Medicine

## 2016-05-23 ENCOUNTER — Encounter: Payer: Self-pay | Admitting: Emergency Medicine

## 2016-05-23 DIAGNOSIS — K5900 Constipation, unspecified: Secondary | ICD-10-CM

## 2016-05-23 DIAGNOSIS — Z79899 Other long term (current) drug therapy: Secondary | ICD-10-CM | POA: Insufficient documentation

## 2016-05-23 DIAGNOSIS — M545 Low back pain: Secondary | ICD-10-CM | POA: Insufficient documentation

## 2016-05-23 DIAGNOSIS — G8929 Other chronic pain: Secondary | ICD-10-CM

## 2016-05-23 DIAGNOSIS — I10 Essential (primary) hypertension: Secondary | ICD-10-CM | POA: Insufficient documentation

## 2016-05-23 DIAGNOSIS — E039 Hypothyroidism, unspecified: Secondary | ICD-10-CM | POA: Diagnosis not present

## 2016-05-23 NOTE — ED Triage Notes (Signed)
Patient states that she was seen here 2 days ago and was diagnosed with a compression fracture. Patient states that she is having lower back pain. Patient states that she was also seen for constipation.  Patient states that she had a large bowel movement Saturday morning after taking the medications she was prescribed but has not had a good bowel movement since.

## 2016-05-23 NOTE — Discharge Instructions (Signed)
From Dr. Reita Cliche:    We discussed that I think you're low back pain is coming from the subacute compression spine fracture, and that you should continue Tylenol 500 mg every 4 hours as needed for pain which is available over-the-counter. You should continue meloxicam as prescribed for moderate pain and help with inflammation, as prescribed. You should continue the cyclobenzaprine muscle relaxer as prescribed.  I think you should probably restart the tramadol to help with the spine pain, however this is likely to contribute to developing constipation, and so make sure that you continue to take MiraLAX and psyllium to prevent constipation problems.  To aid in completing your clear out from the constipation, you may try the magnesium citrate again, you made to one half bottle once or twice a day until your stomach is feeling better.  Return to the emergency department for any worsening condition including black or bloody stools, vomiting, worsening abdominal pain or back pain, weakness or numbness, fever, or any other symptoms concerning to you.

## 2016-05-23 NOTE — ED Notes (Signed)
Pt. States she was here 2 days ago for lower back pain.  Pt. States no relief with pain medication.  Pt. Has not filled medication prescription for constipation. Pt. Thinks bowels are still full.  Pt. States small bowel movement at around 2 am this morning.

## 2016-05-23 NOTE — ED Provider Notes (Signed)
[facility] Emergency Department Provider Note ____________________________________________   I have reviewed the triage vital signs and the triage nursing note.  HISTORY  Chief Complaint Back Pain and Constipation   Historian Patient  HPI QUANISHA PIASECKI is a 82 y.o. female here for low back pain which is moderate to severe, especially worse when sitting or trying to stand up, but feels better once stood up.  She had a fall 4 weeks ago where she slipped and thinks it's probably been hurting since then. She's also had a dull mid and lower stomach ache associated with constipation for probably about 2 weeks now. She was seen in by a PA at her primary doctor's office, Dr. Lovie Macadamia and was started on meloxicam and Flexeril and a PPI. She then saw urgent care who placed her on tramadol and MiraLAX and for pysillium. She was then seen a few days ago in the emergency department where she had a CT scan which showed significant stool burden consistent with constipation, at that point felt to be due to narcotic use and she was instructed to stop the tramadol and take magnesium citrate.  She states that since stopping the tramadol her back pain is really bad, but she has had a few good bowel movements including this morning she passed a large hard piece.   No vomiting. No fever. No chest pain or trouble breathing.    Past Medical History:  Diagnosis Date  . Chicken pox   . Hyperlipidemia   . Hypertension   . Hypothyroidism   . Osteopenia     Patient Active Problem List   Diagnosis Date Noted  . UTI 11/21/2008  . ACTINIC KERATOSIS 10/30/2008  . OSTEOARTHRITIS 10/30/2008  . RASH AND OTHER NONSPECIFIC SKIN ERUPTION 10/30/2008  . INTERNAL DERANGEMENT, RIGHT KNEE 04/22/2008  . 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 KNEE PAIN, RIGHT, ACUTE 04/11/2008  . HYPOTHYROIDISM 04/04/2008  . HYPERLIPIDEMIA 04/04/2008  . HYPERTENSION 04/04/2008  . KNEE  SPRAIN 04/04/2008    Past Surgical History:  Procedure Laterality Date  . CARPAL TUNNEL RELEASE Right   . CATARACT EXTRACTION Bilateral   . DILATION AND CURETTAGE OF UTERUS     for post meopausal bleeding  . EYE SURGERY    . HALLUX [place] Right   . UMBILICAL HERNIA REPAIR N/A 05/23/2015   Procedure: HERNIA REPAIR UMBILICAL ADULT;  Surgeon: Leonie Green, MD;  Location: [facility] ORS;  Service: General;  Laterality: N/A;    Prior to Admission medications   Medication Sig Start Date End Date Taking? Authorizing Provider  atorvastatin (LIPITOR) 20 MG tablet Take 20 mg by mouth daily.    Historical Provider, MD  Calcium-Vitamin D-Vitamin K (VIACTIV PO) Take 2 Doses by mouth daily.    Historical Provider, MD  levothyroxine (SYNTHROID, LEVOTHROID) 75 MCG tablet Take 75 mcg by mouth daily before breakfast.    Historical Provider, MD  lisinopril-hydrochlorothiazide (PRINZIDE,ZESTORETIC) 10-12.5 MG tablet Take 1 tablet by mouth daily.    Historical Provider, MD  magnesium citrate SOLN Take 148 mLs (0.5 Bottles total) by mouth as needed for severe constipation. 05/21/16   Merlyn Lot, MD  meloxicam (MOBIC) 7.5 MG tablet Take 1 tablet by mouth daily. 05/10/16   Historical Provider, MD  Multiple Vitamins-Minerals (PRESERVISION AREDS PO) Take 1 tablet by mouth 2 (two) times daily.    Historical Provider, MD  ranitidine (ZANTAC) 150 MG tablet Take 1 tablet by mouth 2 (two)  times daily. 05/13/16   Historical Provider, MD  traMADol (ULTRAM) 50 MG tablet Take 1 tablet by mouth every 6 (six) hours as needed. 05/19/16   Historical Provider, MD    No Known Allergies  No family history on file.  Social History Social History  Substance Use Topics  . Smoking status: Never Smoker  . Smokeless tobacco: Never Used  . Alcohol use Yes     Comment: occasionally    Review of Systems  Constitutional: Negative for fever. Eyes: Negative for visual changes. ENT:  Negative for sore throat. Cardiovascular: Negative for chest pain. Respiratory: Negative for shortness of breath. Gastrointestinal:No vomiting.  No black or bloody stools. Genitourinary: Negative for dysuria. Musculoskeletal: Negative for back pain. Skin: Negative for rash. Neurological: Negative for headache. 10 point Review of Systems otherwise negative ____________________________________________   PHYSICAL EXAM:  VITAL SIGNS: ED Triage Vitals  Enc Vitals Group     BP 05/23/16 0557 (!) 191/97     Pulse Rate 05/23/16 0557 82     Resp 05/23/16 0557 20     Temp 05/23/16 0557 97.7 F (36.5 C)     Temp Source 05/23/16 0557 Oral     SpO2 05/23/16 0557 94 %     Weight 05/23/16 0556 173 lb (78.5 kg)     Height 05/23/16 0556 5\' 1"  (1.549 m)     Head Circumference --      Peak Flow --      Pain Score 05/23/16 0556 5     Pain Loc --      Pain Edu? --      Excl. in [place]? --      Constitutional: Alert and oriented. Well appearing and in no distress. HEENT   Head: Normocephalic and atraumatic.      Eyes: Conjunctivae are normal. PERRL. Normal extraocular movements.      Ears:         Nose: No congestion/rhinnorhea.   Mouth/Throat: Mucous membranes are moist.   Neck: No stridor. Cardiovascular/Chest: Normal rate, regular rhythm.  No murmurs, rubs, or gallops. Respiratory: Normal respiratory effort without tachypnea nor retractions. Breath sounds are clear and equal bilaterally. No wheezes/rales/rhonchi. Gastrointestinal: Soft. No distention, no guarding, no rebound. Nontender. Genitourinary/rectal: No stool in vault, nontender rectal exam. Musculoskeletal: Nontender with normal range of motion in all extremities. No joint effusions.  No lower extremity tenderness.  No edema. Neurologic:  Normal speech and language. No gross or focal neurologic deficits are appreciated. Skin:  Skin is warm, dry and intact. No rash noted. Psychiatric: Mood and affect are normal. Speech and  behavior are normal. Patient exhibits appropriate insight and judgment.   ____________________________________________  LABS (pertinent positives/negatives)  Labs Reviewed - No data to display  ____________________________________________    EKG I, Lisa Roca, MD, the attending physician have personally viewed and interpreted all ECGs.  None ____________________________________________  RADIOLOGY All Xrays were viewed by me. Imaging interpreted by Radiologist.  None __________________________________________  PROCEDURES  Procedure(s) performed: None  Critical Care performed: None  ____________________________________________   ED COURSE / ASSESSMENT AND PLAN  Pertinent labs & imaging results that were available during my care of the patient were reviewed by me and considered in my medical decision making (see chart for details).   Ms. Netzer is back reporting worsening discomfort in her low back since stopping the tramadol 2 days ago, but some improvement in her constipation after magnesium citrate although she still has a mild stomach ache.  No symptoms of obstruction  on exam. She looks well overall. She is mostly concerned about not having been prepared for the bowel movements that were going to occur as a result of taking the magnesium citrate at home.  She stated that she still felt something in the rectum, but on rectal exam nothing in the vault.  We went over her medications, and I recommended that in addition to the Tylenol and meloxicam and Flexeril, she should probably start back on the tramadol because she needs relief from the subacute fracture which is likely causing her low back pain. We discussed that it's potentially 4 weeks out, and may be only another few weeks before that broken bone heals.  She is to try magnesium citrate another few days because she still feels like she is probably full, and then for the duration of taking tramadol she should  continue the preventative MiraLAX and psyllium.    CONSULTATIONS:   None   Patient / Family / Caregiver informed of clinical course, medical decision-making process, and agree with plan.   I discussed return precautions, follow-up instructions, and discharge instructions with patient and/or family.   ___________________________________________   FINAL CLINICAL IMPRESSION(S) / ED DIAGNOSES   Final diagnoses:  Chronic midline low back pain, with sciatica presence unspecified  Constipation, unspecified constipation type              Note: This dictation was prepared with Dragon dictation. Any transcriptional errors that result from this process are unintentional    Lisa Roca, MD 05/23/16 [phone]

## 2016-05-31 ENCOUNTER — Emergency Department
Admission: EM | Admit: 2016-05-31 | Discharge: 2016-05-31 | Disposition: A | Discharge status: Home / Self Care | Payer: Medicare Other | Attending: Emergency Medicine | Admitting: Emergency Medicine

## 2016-05-31 ENCOUNTER — Emergency Department: Payer: Medicare Other

## 2016-05-31 ENCOUNTER — Encounter: Payer: Self-pay | Admitting: Emergency Medicine

## 2016-05-31 DIAGNOSIS — I10 Essential (primary) hypertension: Secondary | ICD-10-CM | POA: Insufficient documentation

## 2016-05-31 DIAGNOSIS — R10A0 Flank pain, unspecified side: Secondary | ICD-10-CM

## 2016-05-31 DIAGNOSIS — E039 Hypothyroidism, unspecified: Secondary | ICD-10-CM | POA: Insufficient documentation

## 2016-05-31 DIAGNOSIS — R109 Unspecified abdominal pain: Secondary | ICD-10-CM

## 2016-05-31 DIAGNOSIS — N390 Urinary tract infection, site not specified: Secondary | ICD-10-CM

## 2016-05-31 DIAGNOSIS — Z79899 Other long term (current) drug therapy: Secondary | ICD-10-CM | POA: Diagnosis not present

## 2016-05-31 DIAGNOSIS — M545 Low back pain, unspecified: Secondary | ICD-10-CM

## 2016-05-31 LAB — CBC
HCT: 42.2 % (ref 35.0–47.0)
HEMOGLOBIN: 14.3 g/dL (ref 12.0–16.0)
MCH: 32.6 pg (ref 26.0–34.0)
MCHC: 33.9 g/dL (ref 32.0–36.0)
MCV: 95.9 fL (ref 80.0–100.0)
PLATELETS: 175 10*3/uL (ref 150–440)
RBC: 4.4 MIL/uL (ref 3.80–5.20)
RDW: 14.3 % (ref 11.5–14.5)
WBC: 7 10*3/uL (ref 3.6–11.0)

## 2016-05-31 LAB — URINALYSIS COMPLETE WITH MICROSCOPIC (ARMC ONLY)
BILIRUBIN URINE: NEGATIVE
Glucose, UA: NEGATIVE mg/dL
HGB URINE DIPSTICK: NEGATIVE
Ketones, ur: NEGATIVE mg/dL
Nitrite: NEGATIVE
PH: 7 (ref 5.0–8.0)
PROTEIN: NEGATIVE mg/dL
Specific Gravity, Urine: 1.006 (ref 1.005–1.030)

## 2016-05-31 LAB — BASIC METABOLIC PANEL
ANION GAP: 6 (ref 5–15)
BUN: 18 mg/dL (ref 6–20)
CHLORIDE: 104 mmol/L (ref 101–111)
CO2: 29 mmol/L (ref 22–32)
Calcium: 9.1 mg/dL (ref 8.9–10.3)
Creatinine, Ser: 0.65 mg/dL (ref 0.44–1.00)
Glucose, Bld: 97 mg/dL (ref 65–99)
POTASSIUM: 4.2 mmol/L (ref 3.5–5.1)
SODIUM: 139 mmol/L (ref 135–145)

## 2016-05-31 MED ORDER — LIDOCAINE 5 % EX PTCH: 1.0000 | MEDICATED_PATCH | Freq: Two times a day (BID) | CUTANEOUS | 0 refills | Status: AC

## 2016-05-31 MED ORDER — CEPHALEXIN 500 MG PO CAPS: 500.0000 mg | ORAL_CAPSULE | Freq: Four times a day (QID) | ORAL | 0 refills | Status: DC

## 2016-05-31 MED ORDER — LIDOCAINE 5 % EX PTCH
MEDICATED_PATCH | CUTANEOUS | Status: AC
  Administered 2016-05-31: 1 via TRANSDERMAL
  Filled 2016-05-31: qty 1

## 2016-05-31 MED ORDER — DEXTROSE 5 % IV SOLN: 1.0000 g | Freq: Once | INTRAVENOUS | Status: DC

## 2016-05-31 MED ORDER — LIDOCAINE 5 % EX PTCH
1.0000 | MEDICATED_PATCH | CUTANEOUS | Status: DC
  Administered 2016-05-31: 1 via TRANSDERMAL

## 2016-05-31 MED ORDER — CEFTRIAXONE SODIUM-DEXTROSE 1-3.74 GM-% IV SOLR
1.0000 g | Freq: Once | INTRAVENOUS | Status: AC
  Administered 2016-05-31: 1 g via INTRAVENOUS
  Filled 2016-05-31: qty 50

## 2016-05-31 NOTE — ED Notes (Signed)
Pt assisted to toilet 

## 2016-05-31 NOTE — ED Triage Notes (Signed)
Patient to [facility] via EMS from home.  States right lower back pain since earlier tonight.  Reports had a fall with compression fracture couple weeks ago.  Patient has pain medications and muscle relaxer but did not take them because this might be something new.

## 2016-05-31 NOTE — ED Notes (Signed)
Patient reports she was sitting at computer desk and started to have pain at right lower back pain.  Denies any type of new injury.

## 2016-05-31 NOTE — ED Provider Notes (Signed)
[facility] Emergency Department Provider Note   ____________________________________________   First MD Initiated Contact with Patient 05/31/16 0532     (approximate)  I have reviewed the triage vital signs and the nursing notes.   HISTORY  Chief Complaint Back Pain    HPI IDANIA OGAZ is a 82 y.o. female who comes into the hospital today with some back pain. The patient has a small fracture in her spine. She reports that she's been here multiple times and has seen her primary care doctor. The patient's husband reports that she's also had lots of gas. She reports that the pain is in her right sided low back and its going down into her hip. She reports that the fracture was more in the middle and Pain is improved. The patient was given some pain medicine for her back but reports that this pain is different. She reports that she is unsure if she twisted or did something to hurt her back. She denies any falls. She's been sitting at the computer and felt it. The patient rates her pain 8 out of 10 in intensity. The pain is worse with movement. The patient has never had this pain before. She denies pain with urination, foul-smelling urine, chest pain, dizziness, headache. She reports that she always has some mild shortness of breath. The patient is here for evaluation and pain control this evening.   Past Medical History:  Diagnosis Date  . Chicken pox   . Hyperlipidemia   . Hypertension   . Hypothyroidism   . Osteopenia     Patient Active Problem List   Diagnosis Date Noted  . UTI 11/21/2008  . ACTINIC KERATOSIS 10/30/2008  . OSTEOARTHRITIS 10/30/2008  . RASH AND OTHER NONSPECIFIC SKIN ERUPTION 10/30/2008  . INTERNAL DERANGEMENT, RIGHT KNEE 04/22/2008  . 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 KNEE PAIN, RIGHT, ACUTE 04/11/2008  . HYPOTHYROIDISM 04/04/2008  . HYPERLIPIDEMIA 04/04/2008  . HYPERTENSION 04/04/2008  . KNEE SPRAIN  04/04/2008    Past Surgical History:  Procedure Laterality Date  . CARPAL TUNNEL RELEASE Right   . CATARACT EXTRACTION Bilateral   . DILATION AND CURETTAGE OF UTERUS     for post meopausal bleeding  . EYE SURGERY    . HALLUX [place] Right   . UMBILICAL HERNIA REPAIR N/A 05/23/2015   Procedure: HERNIA REPAIR UMBILICAL ADULT;  Surgeon: Leonie Green, MD;  Location: [facility] ORS;  Service: General;  Laterality: N/A;    Prior to Admission medications   Medication Sig Start Date End Date Taking? Authorizing Provider  atorvastatin (LIPITOR) 20 MG tablet Take 20 mg by mouth daily.   Yes Historical Provider, MD  Calcium-Vitamin D-Vitamin K (VIACTIV PO) Take 2 Doses by mouth daily.   Yes Historical Provider, MD  levothyroxine (SYNTHROID, LEVOTHROID) 75 MCG tablet Take 75 mcg by mouth daily before breakfast.   Yes Historical Provider, MD  lisinopril-hydrochlorothiazide (PRINZIDE,ZESTORETIC) 10-12.5 MG tablet Take 1 tablet by mouth daily.   Yes Historical Provider, MD  meloxicam (MOBIC) 7.5 MG tablet Take 1 tablet by mouth daily. 05/10/16  Yes Historical Provider, MD  traMADol (ULTRAM) 50 MG tablet Take 1 tablet by mouth every 6 (six) hours as needed. 05/19/16  Yes Historical Provider, MD  cephALEXin (KEFLEX) 500 MG capsule Take 1 capsule (500 mg total) by mouth 4 (four) times daily. 05/31/16   Loney Hering, MD  lidocaine (LIDODERM) 5 % Place 1 patch onto  the skin every 12 (twelve) hours. Remove & Discard patch within 12 hours or as directed by MD 05/31/16 05/31/17  Loney Hering, MD  magnesium citrate SOLN Take 148 mLs (0.5 Bottles total) by mouth as needed for severe constipation. 05/21/16   Merlyn Lot, MD  Multiple Vitamins-Minerals (PRESERVISION AREDS PO) Take 1 tablet by mouth 2 (two) times daily.    Historical Provider, MD  ranitidine (ZANTAC) 150 MG tablet Take 1 tablet by mouth 2 (two) times daily. 05/13/16   Historical Provider, MD     Allergies Patient has no known allergies.  No family history on file.  Social History Social History  Substance Use Topics  . Smoking status: Never Smoker  . Smokeless tobacco: Never Used  . Alcohol use Yes     Comment: occasionally    Review of Systems Constitutional: No fever/chills Eyes: No visual changes. ENT: No sore throat. Cardiovascular: Denies chest pain. Respiratory: Denies shortness of breath. Gastrointestinal: No abdominal pain.  No nausea, no vomiting.  No diarrhea.  No constipation. Genitourinary: Negative for dysuria. Musculoskeletal:  back pain. Skin: Negative for rash. Neurological: Negative for headaches, focal weakness or numbness.  10-point ROS otherwise negative.  ____________________________________________   PHYSICAL EXAM:  VITAL SIGNS: ED Triage Vitals  Enc Vitals Group     BP 05/31/16 0507 (!) 191/97     Pulse Rate 05/31/16 0507 74     Resp 05/31/16 0507 16     Temp --      Temp src --      SpO2 05/31/16 0507 96 %     Weight --      Height --      Head Circumference --      Peak Flow --      Pain Score 05/31/16 0505 8     Pain Loc --      Pain Edu? --      Excl. in [place]? --     Constitutional: Alert and oriented. Well appearing and in Mild distress. Eyes: Conjunctivae are normal. PERRL. EOMI. Head: Atraumatic. Nose: No congestion/rhinnorhea. Mouth/Throat: Mucous membranes are moist.  Oropharynx non-erythematous. Cardiovascular: Normal rate, regular rhythm. Grossly normal heart sounds.  Good peripheral circulation. Respiratory: Normal respiratory effort.  No retractions. Lungs CTAB. Gastrointestinal: Soft and nontender. No distention. Positive bowel sounds Musculoskeletal: Right sided low back pain to palpation with a negative straight leg raise. Neurologic:  Normal speech and language. Skin:  Skin is warm, dry and intact. Marland Kitchen Psychiatric: Mood and affect are normal.   ____________________________________________    LABS (all labs ordered are listed, but only abnormal results are displayed)  Labs Reviewed  URINALYSIS COMPLETEWITH MICROSCOPIC ([place] ONLY) - Abnormal; Notable for the following:       Result Value   Color, Urine STRAW (*)    APPearance CLEAR (*)    Leukocytes, UA 3+ (*)    Bacteria, UA RARE (*)    Squamous Epithelial / LPF 0-5 (*)    All other components within normal limits  URINE CULTURE  CBC  BASIC METABOLIC PANEL   ____________________________________________  EKG  none ____________________________________________  RADIOLOGY  Lumbar spine x-ray ____________________________________________   PROCEDURES  Procedure(s) performed: None  Procedures  Critical Care performed: No  ____________________________________________   INITIAL IMPRESSION / ASSESSMENT AND PLAN / ED COURSE  Pertinent labs & imaging results that were available during my care of the patient were reviewed by me and considered in my medical decision making (see chart for details).  This  is an 82 year old woman who comes into the hospital today with some right-sided low back pain. The patient does have a spinal fracture but she is unsure if that may be causing her symptoms. I will give the patient a Lidoderm patch to her back as well as check her urine with a concern for a kidney stone or infection. I will also check an x-ray looking for the patient's fracture and ensuring that there is no worsening symptoms. The patient will be reassessed.  Clinical Course    The patient reports that she feels improved. She does have too numerous to count whites in her urine as well as leukoesterase. I feel that the patient may have UTI causing her symptoms. She also has a T12 compression fracture. She also has some moderate compression of L1. I will check a CBC and a BMP and if it is unremarkable the patient will be discharged home. She'll receive a dose of ceftriaxone. Dr. Corky Downs will reassess the  patient.  ____________________________________________   FINAL CLINICAL IMPRESSION(S) / ED DIAGNOSES  Final diagnoses:  Acute right-sided low back pain without sciatica  Urinary tract infection without hematuria, site unspecified  Flank pain      NEW MEDICATIONS STARTED DURING THIS VISIT:  New Prescriptions   CEPHALEXIN (KEFLEX) 500 MG CAPSULE    Take 1 capsule (500 mg total) by mouth 4 (four) times daily.   LIDOCAINE (LIDODERM) 5 %    Place 1 patch onto the skin every 12 (twelve) hours. Remove & Discard patch within 12 hours or as directed by MD     Note:  This document was prepared using Dragon voice recognition software and may include unintentional dictation errors.    Loney Hering, MD 05/31/16 [phone]

## 2016-05-31 NOTE — ED Notes (Signed)
Resumed care from [place]. Pt denies pain but states she hurts more when she stands. Pt alert & oriented with NAD Noted.

## 2016-05-31 NOTE — ED Notes (Signed)
Pt discharged home after verbalizing understanding of discharge instructions; nad noted. 

## 2016-06-01 LAB — URINE CULTURE

## 2017-05-05 DIAGNOSIS — H3530 Unspecified macular degeneration: Secondary | ICD-10-CM | POA: Insufficient documentation

## 2017-05-11 ENCOUNTER — Other Ambulatory Visit: Payer: Self-pay | Admitting: Family Medicine

## 2017-05-11 DIAGNOSIS — R9389 Abnormal findings on diagnostic imaging of other specified body structures: Secondary | ICD-10-CM

## 2017-05-19 ENCOUNTER — Ambulatory Visit
Admission: RE | Admit: 2017-05-19 | Discharge: 2017-05-19 | Disposition: A | Discharge status: Home / Self Care | Payer: Medicare Other | Source: Ambulatory Visit | Attending: Family Medicine | Admitting: Family Medicine

## 2017-05-19 DIAGNOSIS — E079 Disorder of thyroid, unspecified: Secondary | ICD-10-CM | POA: Insufficient documentation

## 2017-05-19 DIAGNOSIS — R9389 Abnormal findings on diagnostic imaging of other specified body structures: Secondary | ICD-10-CM | POA: Diagnosis present

## 2017-05-19 DIAGNOSIS — R918 Other nonspecific abnormal finding of lung field: Secondary | ICD-10-CM | POA: Diagnosis not present

## 2017-05-19 DIAGNOSIS — I700 Atherosclerosis of aorta: Secondary | ICD-10-CM | POA: Diagnosis not present

## 2017-05-19 DIAGNOSIS — I2510 Atherosclerotic heart disease of native coronary artery without angina pectoris: Secondary | ICD-10-CM | POA: Insufficient documentation

## 2017-05-19 DIAGNOSIS — M4856XA Collapsed vertebra, not elsewhere classified, lumbar region, initial encounter for fracture: Secondary | ICD-10-CM | POA: Diagnosis not present

## 2017-05-19 MED ORDER — IOPAMIDOL (ISOVUE-300) INJECTION 61%
75.0000 mL | Freq: Once | INTRAVENOUS | Status: AC | PRN
  Administered 2017-05-19: 75 mL via INTRAVENOUS

## 2017-06-25 DIAGNOSIS — C73 Malignant neoplasm of thyroid gland: Secondary | ICD-10-CM

## 2017-06-25 HISTORY — DX: Malignant neoplasm of thyroid gland: C73

## 2017-07-22 ENCOUNTER — Other Ambulatory Visit: Payer: Self-pay | Admitting: Internal Medicine

## 2017-07-22 DIAGNOSIS — C73 Malignant neoplasm of thyroid gland: Secondary | ICD-10-CM

## 2017-07-29 ENCOUNTER — Other Ambulatory Visit: Payer: Self-pay | Admitting: Internal Medicine

## 2017-07-29 ENCOUNTER — Ambulatory Visit: Payer: Medicare Other

## 2017-07-29 DIAGNOSIS — C73 Malignant neoplasm of thyroid gland: Secondary | ICD-10-CM

## 2017-08-01 ENCOUNTER — Ambulatory Visit
Admission: RE | Admit: 2017-08-01 | Discharge: 2017-08-01 | Disposition: A | Discharge status: Home / Self Care | Payer: Medicare Other | Source: Ambulatory Visit | Attending: Internal Medicine | Admitting: Internal Medicine

## 2017-08-04 ENCOUNTER — Encounter
Admission: RE | Admit: 2017-08-04 | Discharge: 2017-08-04 | Disposition: A | Discharge status: Home / Self Care | Payer: Medicare Other | Source: Ambulatory Visit | Attending: Internal Medicine | Admitting: Internal Medicine

## 2017-08-04 DIAGNOSIS — C73 Malignant neoplasm of thyroid gland: Secondary | ICD-10-CM | POA: Insufficient documentation

## 2017-08-04 HISTORY — DX: Malignant neoplasm of thyroid gland: C73

## 2017-08-04 MED ORDER — SODIUM IODIDE I 131 CAPSULE
103.0000 | Freq: Once | INTRAVENOUS | Status: AC | PRN
  Administered 2017-08-04: 102.778 via ORAL

## 2017-08-12 ENCOUNTER — Encounter
Admission: RE | Admit: 2017-08-12 | Discharge: 2017-08-12 | Disposition: A | Discharge status: Home / Self Care | Payer: Medicare Other | Source: Ambulatory Visit | Attending: Internal Medicine | Admitting: Internal Medicine

## 2017-08-12 DIAGNOSIS — C73 Malignant neoplasm of thyroid gland: Secondary | ICD-10-CM | POA: Insufficient documentation

## 2017-12-26 ENCOUNTER — Other Ambulatory Visit: Payer: Self-pay | Admitting: Internal Medicine

## 2017-12-26 DIAGNOSIS — C73 Malignant neoplasm of thyroid gland: Secondary | ICD-10-CM

## 2018-05-08 ENCOUNTER — Ambulatory Visit: Payer: Medicare Other

## 2018-05-09 ENCOUNTER — Ambulatory Visit: Payer: Medicare Other

## 2018-05-10 ENCOUNTER — Ambulatory Visit: Payer: Medicare Other

## 2019-05-11 DIAGNOSIS — M8580 Other specified disorders of bone density and structure, unspecified site: Secondary | ICD-10-CM | POA: Insufficient documentation

## 2021-09-20 ENCOUNTER — Other Ambulatory Visit: Payer: Self-pay

## 2021-09-20 ENCOUNTER — Encounter: Payer: Self-pay | Admitting: Emergency Medicine

## 2021-09-20 ENCOUNTER — Emergency Department: Payer: Medicare Other

## 2021-09-20 ENCOUNTER — Emergency Department
Admission: EM | Admit: 2021-09-20 | Discharge: 2021-09-20 | Disposition: A | Discharge status: Home / Self Care | Payer: Medicare Other | Attending: Emergency Medicine | Admitting: Emergency Medicine

## 2021-09-20 DIAGNOSIS — J349 Unspecified disorder of nose and nasal sinuses: Secondary | ICD-10-CM | POA: Diagnosis not present

## 2021-09-20 DIAGNOSIS — W010XXA Fall on same level from slipping, tripping and stumbling without subsequent striking against object, initial encounter: Secondary | ICD-10-CM | POA: Diagnosis not present

## 2021-09-20 DIAGNOSIS — W19XXXA Unspecified fall, initial encounter: Secondary | ICD-10-CM

## 2021-09-20 DIAGNOSIS — K130 Diseases of lips: Secondary | ICD-10-CM | POA: Diagnosis not present

## 2021-09-20 DIAGNOSIS — S0990XA Unspecified injury of head, initial encounter: Secondary | ICD-10-CM | POA: Diagnosis present

## 2021-09-20 DIAGNOSIS — Z23 Encounter for immunization: Secondary | ICD-10-CM | POA: Insufficient documentation

## 2021-09-20 DIAGNOSIS — Y92009 Unspecified place in unspecified non-institutional (private) residence as the place of occurrence of the external cause: Secondary | ICD-10-CM | POA: Diagnosis not present

## 2021-09-20 MED ORDER — TETANUS-DIPHTH-ACELL PERTUSSIS 5-2.5-18.5 LF-MCG/0.5 IM SUSY
0.5000 mL | PREFILLED_SYRINGE | Freq: Once | INTRAMUSCULAR | Status: AC
  Administered 2021-09-20: 0.5 mL via INTRAMUSCULAR
  Filled 2021-09-20: qty 0.5

## 2021-09-20 NOTE — ED Triage Notes (Addendum)
Pt via POV from home. Pt c/o mechanical fall, pt tripped on a rug in her garage and landed face forward on the concrete. Denies any LOC. Denies any blood thinners. Bruising noted to pt's nose, inner eyes, and top lip. Denies any NVD. Denies blurred vision. Pt is A&Ox4 and NAD. Ambulatory to triage.

## 2021-09-20 NOTE — Discharge Instructions (Addendum)
I have given you a tetanus shot.  This might make your arm sore for few days.  Tylenol or Advil for a day or 2 should help with that.  Keep the scratch on your nose clean and dry.  It looks like you are missing a little bit of skin there.  You do have a possible crack in your nose although its not out of place.  I have given you the phone number for the ear nose and throat doctor you can follow-up with them just before you leave if possible.  Since it is not out of place, if you do not bump it again, nothing should need to be done.  I have given you the head injury instructions just in case.  I do not anticipate you having any problems.  I do not think you need to wake up every 2 hours tonight.  But if you do develop a bad headache or nausea or vomiting or numbness or weakness or blurry vision you should come back right away.  Again I do not anticipate that this will happen and I think you should be fine for your trip to the [place] in 4 days.

## 2021-09-20 NOTE — ED Provider Notes (Signed)
[facility] Provider Note    Event Date/Time   First MD Initiated Contact with Patient 09/20/21 1529     (approximate)   History   Fall   HPI  Yolanda Wade is a 88 y.o. female who reports she was in a hurry and tripped over the carpet and fell on her face.  She did not pass out.  She complains of a small amount of pain in her nose at the nasal bridge.  She reports it is very mild.  She has normal vision.  She has a little bit of pain in the upper lip also.  No other pain.      Physical Exam   Triage Vital Signs: ED Triage Vitals [09/20/21 1347]  Enc Vitals Group     BP (!) 156/96     Pulse Rate 77     Resp 18     Temp 98.4 F (36.9 C)     Temp Source Oral     SpO2 96 %     Weight 162 lb (73.5 kg)     Height 4\' 10"  (1.473 m)     Head Circumference      Peak Flow      Pain Score 2     Pain Loc      Pain Edu?      Excl. in [place]?     Most recent vital signs: Vitals:   09/20/21 1347  BP: (!) 156/96  Pulse: 77  Resp: 18  Temp: 98.4 F (36.9 C)  SpO2: 96%     General: Awake, alert, thinking normally no distress.  Head: Bilateral black eyes consistent with raccoon eyes.  There is scratch across nasal bridge.  It is fairly deep there appears to be a little bit of skin missing.  I warned the patient that scarring.  It does not appear to have anything I can suture.  I do not see any nasal septal hematoma.  She has a bruised upper lip but reports no loose teeth. Neck is supple no tenderness CV:  Good peripheral perfusion.  Heart regular rate and rhythm no audible murmur Resp:  Normal effort.  Lungs are clear no chest tender Abd:  No distention.  Patient able to walk and speak normally.  She has no extremity tenderness   ED Results / Procedures / Treatments   Labs (all labs ordered are listed, but only abnormal results are displayed) Labs Reviewed - No data to display   EKG     RADIOLOGY CT of the head read by radiology pictures  reviewed by me shows no acute disease no basilar skull fracture was seen CT of the neck read by radiology pictures reviewed by me show no acute disease there is some DJD. CT of the maxillofacial area read by radiology as possible nondisplaced possible subacute nasal fracture.  I also reviewed these films.   PROCEDURES:  Critical Care performed:   Procedures   MEDICATIONS ORDERED IN ED: Medications  Tdap (BOOSTRIX) injection 0.5 mL (has no administration in time range)     IMPRESSION / MDM / ASSESSMENT AND PLAN / ED COURSE  I reviewed the triage vital signs and the nursing notes. Patient is unsure when she got her last tetanus shot.  We will give her 1 of these.  I will give the patient head injury instructions just in case. There does not seem to be any nasal septal hematoma or intracranial pathology.  Patient somewhat worried because they  had a friend that fell in a couple weeks later got some bleeding in the brain.  I told the patient that this is very unusual but possible and she needs to return if she has any increasing headache nausea vomiting etc.  When I read the patient's history I initially considered admitting her but she is doing well and does not need admission at this time.        FINAL CLINICAL IMPRESSION(S) / ED DIAGNOSES   Final diagnoses:  Fall, initial encounter  Injury of head, initial encounter     Rx / DC Orders   ED Discharge Orders     None        Note:  This document was prepared using Dragon voice recognition software and may include unintentional dictation errors.   Nena Polio, MD 09/20/21 1550

## 2021-09-22 ENCOUNTER — Other Ambulatory Visit: Payer: Self-pay

## 2021-09-22 ENCOUNTER — Ambulatory Visit: Payer: Medicare Other | Admitting: Dermatology

## 2021-09-22 DIAGNOSIS — C4441 Basal cell carcinoma of skin of scalp and neck: Secondary | ICD-10-CM | POA: Diagnosis not present

## 2021-09-22 DIAGNOSIS — C4491 Basal cell carcinoma of skin, unspecified: Secondary | ICD-10-CM

## 2021-09-22 DIAGNOSIS — D1801 Hemangioma of skin and subcutaneous tissue: Secondary | ICD-10-CM | POA: Diagnosis not present

## 2021-09-22 DIAGNOSIS — L578 Other skin changes due to chronic exposure to nonionizing radiation: Secondary | ICD-10-CM

## 2021-09-22 DIAGNOSIS — L821 Other seborrheic keratosis: Secondary | ICD-10-CM

## 2021-09-22 DIAGNOSIS — D485 Neoplasm of uncertain behavior of skin: Secondary | ICD-10-CM

## 2021-09-22 DIAGNOSIS — L820 Inflamed seborrheic keratosis: Secondary | ICD-10-CM | POA: Diagnosis not present

## 2021-09-22 HISTORY — DX: Basal cell carcinoma of skin, unspecified: C44.91

## 2021-09-22 NOTE — Patient Instructions (Addendum)
Wound Care Instructions  Cleanse wound gently with soap and water once a day then pat dry with clean gauze. Apply a thing coat of Petrolatum (petroleum jelly, "Vaseline") over the wound (unless you have an allergy to this). We recommend that you use a new, sterile tube of Vaseline. Do not pick or remove scabs. Do not remove the yellow or white "healing tissue" from the base of the wound.  Cover the wound with fresh, clean, nonstick gauze and secure with paper tape. You may use Band-Aids in place of gauze and tape if the would is small enough, but would recommend trimming much of the tape off as there is often too much. Sometimes Band-Aids can irritate the skin.  You should call the office for your biopsy report after 1 week if you have not already been contacted.  If you experience any problems, such as abnormal amounts of bleeding, swelling, significant bruising, significant pain, or evidence of infection, please call the office immediately.  FOR ADULT SURGERY PATIENTS: If you need something for pain relief you may take 1 extra strength Tylenol (acetaminophen) AND 2 Ibuprofen (200mg  each) together every 4 hours as needed for pain. (do not take these if you are allergic to them or if you have a reason you should not take them.) Typically, you may only need pain medication for 1 to 3 days.     Cryotherapy Aftercare  Wash gently with soap and water everyday.   Apply Vaseline and Band-Aid daily until healed.    Seborrheic Keratosis  What causes seborrheic keratoses? Seborrheic keratoses are harmless, common skin growths that first appear during adult life.  As time goes by, more growths appear.  Some people may develop a large number of them.  Seborrheic keratoses appear on both covered and uncovered body parts.  They are not caused by sunlight.  The tendency to develop seborrheic keratoses can be inherited.  They vary in color from skin-colored to gray, brown, or even black.  They can be either  smooth or have a rough, warty surface.   Seborrheic keratoses are superficial and look as if they were stuck on the skin.  Under the microscope this type of keratosis looks like layers upon layers of skin.  That is why at times the top layer may seem to fall off, but the rest of the growth remains and re-grows.    Treatment Seborrheic keratoses do not need to be treated, but can easily be removed in the office.  Seborrheic keratoses often cause symptoms when they rub on clothing or jewelry.  Lesions can be in the way of shaving.  If they become inflamed, they can cause itching, soreness, or burning.  Removal of a seborrheic keratosis can be accomplished by freezing, burning, or surgery. If any spot bleeds, scabs, or grows rapidly, please return to have it checked, as these can be an indication of a skin cancer.   If You Need Anything After Your Visit  If you have any questions or concerns for your doctor, please call our main line at [phone] and press option 4 to reach your doctor's medical assistant. If no one answers, please leave a voicemail as directed and we will return your call as soon as possible. Messages left after 4 pm will be answered the following business day.   You may also send [place] a message via [place]. We typically respond to MyChart messages within 1-2 business days.  For prescription refills, please ask your pharmacy to contact our office.  Our fax number is [phone].  If you have an urgent issue when the clinic is closed that cannot wait until the next business day, you can page your doctor at the number below.    Please note that while we do our best to be available for urgent issues outside of office hours, we are not available 24/7.   If you have an urgent issue and are unable to reach [place], you may choose to seek medical care at your doctor's office, retail clinic, urgent care center, or emergency room.  If you have a medical emergency, please immediately call 911  or go to the emergency department.  Pager Numbers  - Dr. Nehemiah Massed: [phone]  - Dr. Laurence Ferrari: [phone]  - Dr. Nicole Kindred: [phone]  In the event of inclement weather, please call our main line at [phone] for an update on the status of any delays or closures.  Dermatology Medication Tips: Please keep the boxes that topical medications come in in order to help keep track of the instructions about where and how to use these. Pharmacies typically print the medication instructions only on the boxes and not directly on the medication tubes.   If your medication is too expensive, please contact our office at [phone] option 4 or send [place] a message through [place].   We are unable to tell what your co-pay for medications will be in advance as this is different depending on your insurance coverage. However, we may be able to find a substitute medication at lower cost or fill out paperwork to get insurance to cover a needed medication.   If a prior authorization is required to get your medication covered by your insurance company, please allow [place] 1-2 business days to complete this process.  Drug prices often vary depending on where the prescription is filled and some pharmacies may offer cheaper prices.  The website www.goodrx.com contains coupons for medications through different pharmacies. The prices here do not account for what the cost may be with help from insurance (it may be cheaper with your insurance), but the website can give you the price if you did not use any insurance.  - You can print the associated coupon and take it with your prescription to the pharmacy.  - You may also stop by our office during regular business hours and pick up a GoodRx coupon card.  - If you need your prescription sent electronically to a different pharmacy, notify our office through [facility] or by phone at [phone] option 4.     Si Usted Necesita Algo Despus de Su  Visita  Tambin puede enviarnos un mensaje a travs de Pharmacist, community. Por lo general respondemos a los mensajes de MyChart en el transcurso de 1 a 2 das hbiles.  Para renovar recetas, por favor pida a su farmacia que se ponga en contacto con nuestra oficina. Harland Dingwall de fax es [place] [phone].  Si tiene un asunto urgente cuando la clnica est cerrada y que no puede esperar hasta el siguiente da hbil, puede llamar/localizar a su doctor(a) al nmero que aparece a continuacin.   Por favor, tenga en cuenta que aunque hacemos todo lo posible para estar disponibles para asuntos urgentes fuera del horario de [place], no estamos disponibles las 24 horas del da, los 7 das de la [place].   Si tiene un problema urgente y no puede comunicarse con nosotros, puede optar por buscar atencin mdica  en el consultorio de su doctor(a), en una clnica privada, en un  centro de atencin urgente o en una sala de emergencias.  Si tiene Engineering geologist, por favor llame inmediatamente al 911 o vaya a la sala de emergencias.  Nmeros de bper  - Dr. Nehemiah Massed: [phone]  - Dra. Moye: [phone]  - Dra. Nicole Kindred: [phone]  En caso de inclemencias del [place], por favor llame a Johnsie Kindred principal al [phone] para una actualizacin sobre el [place] de cualquier retraso o cierre.  Consejos para la medicacin en dermatologa: Por favor, guarde las cajas en las que vienen los medicamentos de uso tpico para ayudarle a seguir las instrucciones sobre dnde y cmo usarlos. Las farmacias generalmente imprimen las instrucciones del medicamento slo en las cajas y no directamente en los tubos del [place].   Si su medicamento es muy caro, por favor, pngase en contacto con Zigmund Daniel llamando al [phone] y presione la opcin 4 o envenos un mensaje a travs de Pharmacist, community.   No podemos decirle cul ser su copago por los medicamentos por adelantado ya que esto es diferente dependiendo de la  cobertura de su seguro. Sin embargo, es posible que podamos encontrar un medicamento sustituto a Electrical engineer un formulario para que el seguro cubra el medicamento que se considera necesario.   Si se requiere una autorizacin previa para que su compaa de seguros [place] su medicamento, por favor permtanos de 1 a 2 das hbiles para completar este proceso.  Los precios de los medicamentos varan con frecuencia dependiendo del Environmental consultant de dnde se surte la receta y alguna farmacias pueden ofrecer precios ms baratos.  El sitio web www.goodrx.com tiene cupones para medicamentos de Airline pilot. Los precios aqu no tienen en cuenta lo que podra costar con la ayuda del seguro (puede ser ms barato con su seguro), pero el sitio web puede darle el precio si no utiliz Research scientist (physical sciences).  - Puede imprimir el cupn correspondiente y llevarlo con su receta a la farmacia.  - Tambin puede pasar por nuestra oficina durante el horario de atencin regular y Charity fundraiser una tarjeta de cupones de GoodRx.  - Si necesita que su receta se enve electrnicamente a una farmacia diferente, informe a nuestra oficina a travs de MyChart de [place] o por telfono llamando al [phone] y presione la opcin 4.

## 2021-09-22 NOTE — Progress Notes (Signed)
New Patient Visit  Subjective  Yolanda Wade is a 88 y.o. female who presents for the following: growth (Left neck, present for several months. Also check a few spots on the back and inframammary.). Spots on back get irritated by bra.  Patient accompanied by husband.   The following portions of the chart were reviewed this encounter and updated as appropriate:       Review of Systems:  No other skin or systemic complaints except as noted in HPI or Assessment and Plan.  Objective  Well appearing patient in no apparent distress; mood and affect are within normal limits.  A focused examination was performed including face, trunk. Relevant physical exam findings are noted in the Assessment and Plan.  Spinal Mid Back x 1, R mid back x 1 (2) Erythematous stuck-on, waxy papule   Left Neck 10.0 x 5.0 mm pink crusted papule       Assessment & Plan   Seborrheic Keratoses - Stuck-on, waxy, tan-brown papules and/or plaques, including left medial breast - Benign-appearing - Discussed benign etiology and prognosis. - Observe - Call for any changes  Hemangiomas - Red papules, including left mid back - Discussed benign nature - Observe - Call for any changes  Actinic Damage - chronic, secondary to cumulative UV radiation exposure/sun exposure over time - diffuse scaly erythematous macules with underlying dyspigmentation - Recommend daily broad spectrum sunscreen SPF 30+ to sun-exposed areas, reapply every 2 hours as needed.  - Recommend staying in the shade or wearing long sleeves, sun glasses (UVA+UVB protection) and wide brim hats (4-inch brim around the entire circumference of the hat). - Call for new or changing lesions.   Inflamed seborrheic keratosis (2) Spinal Mid Back x 1, R mid back x 1  Destruction of lesion - Spinal Mid Back x 1, R mid back x 1  Destruction method: cryotherapy   Informed consent: discussed and consent obtained   Lesion destroyed using  liquid nitrogen: Yes   Region frozen until ice ball extended beyond lesion: Yes   Outcome: patient tolerated procedure well with no complications   Post-procedure details: wound care instructions given   Additional details:  Prior to procedure, discussed risks of blister formation, small wound, skin dyspigmentation, or rare scar following cryotherapy. Recommend Vaseline ointment to treated areas while healing.   Neoplasm of uncertain behavior of skin Left Neck  Skin / nail biopsy Type of biopsy: tangential   Informed consent: discussed and consent obtained   Patient was prepped and draped in usual sterile fashion: Area prepped with alcohol. Anesthesia: the lesion was anesthetized in a standard fashion   Anesthetic:  1% lidocaine w/ epinephrine 1-100,000 buffered w/ 8.4% NaHCO3 Instrument used: flexible razor blade   Hemostasis achieved with: pressure, aluminum chloride and electrodesiccation   Outcome: patient tolerated procedure well    Destruction of lesion  Destruction method: electrodesiccation and curettage   Informed consent: discussed and consent obtained   Timeout:  patient name, date of birth, surgical site, and procedure verified Curettage performed in three different directions: Yes   Electrodesiccation performed over the curetted area: Yes   Final wound size (cm):  1.2 Hemostasis achieved with:  pressure, aluminum chloride and electrodesiccation Outcome: patient tolerated procedure well with no complications   Post-procedure details: wound care instructions given   Post-procedure details comment:  Ointment and bandage applied.  Specimen 1 - Surgical pathology Differential Diagnosis: BCC vs other Check Margins: No 10.0 x 5.0 mm pink crusted papule EDC  today *2 pieces  Biopsy/EDC today, r/o BCC Pt will be out of town. Leave bx results with husband, [name].   Return in about 6 months (around 03/22/2022) for f/u bx.  IJamesetta Orleans, CMA, am acting as scribe for  Brendolyn Patty, MD .  Documentation: I have reviewed the above documentation for accuracy and completeness, and I agree with the above.  Brendolyn Patty MD

## 2021-09-28 ENCOUNTER — Telehealth: Payer: Self-pay

## 2021-09-28 NOTE — Telephone Encounter (Signed)
-----   Message from Brendolyn Patty, MD sent at 09/28/2021  9:49 AM EST ----- ?Skin , left neck ?BASAL CELL CARCINOMA, NODULAR PATTERN, BASE INVOLVED ? ?BCC skin cancer- already treated with EDC at time of biopsy ?  ? - please call patient ?

## 2021-09-28 NOTE — Telephone Encounter (Signed)
Left pt msg to call for bx results/sh 

## 2021-09-29 ENCOUNTER — Telehealth: Payer: Self-pay

## 2021-09-29 NOTE — Telephone Encounter (Signed)
-----   Message from Brendolyn Patty, MD sent at 09/28/2021  9:49 AM EST ----- ?Skin , left neck ?BASAL CELL CARCINOMA, NODULAR PATTERN, BASE INVOLVED ? ?BCC skin cancer- already treated with EDC at time of biopsy ?  ? - please call patient ?

## 2021-09-29 NOTE — Telephone Encounter (Signed)
Patient's spouse advised of BX results. He is listed on patient's HIPPA. aw ?

## 2022-04-20 ENCOUNTER — Ambulatory Visit: Payer: Medicare Other | Admitting: Dermatology

## 2022-05-19 ENCOUNTER — Ambulatory Visit: Payer: Medicare Other | Admitting: Dermatology

## 2022-05-19 DIAGNOSIS — Z85828 Personal history of other malignant neoplasm of skin: Secondary | ICD-10-CM | POA: Diagnosis not present

## 2022-05-19 DIAGNOSIS — L821 Other seborrheic keratosis: Secondary | ICD-10-CM

## 2022-05-19 DIAGNOSIS — L814 Other melanin hyperpigmentation: Secondary | ICD-10-CM

## 2022-05-19 DIAGNOSIS — D1801 Hemangioma of skin and subcutaneous tissue: Secondary | ICD-10-CM

## 2022-05-19 DIAGNOSIS — L219 Seborrheic dermatitis, unspecified: Secondary | ICD-10-CM | POA: Diagnosis not present

## 2022-05-19 DIAGNOSIS — L304 Erythema intertrigo: Secondary | ICD-10-CM | POA: Diagnosis not present

## 2022-05-19 DIAGNOSIS — L578 Other skin changes due to chronic exposure to nonionizing radiation: Secondary | ICD-10-CM

## 2022-05-19 DIAGNOSIS — L820 Inflamed seborrheic keratosis: Secondary | ICD-10-CM

## 2022-05-19 MED ORDER — HYDROCORTISONE 2.5 % EX CREA: TOPICAL_CREAM | CUTANEOUS | 2 refills | Status: DC

## 2022-05-19 MED ORDER — KETOCONAZOLE 2 % EX CREA: TOPICAL_CREAM | CUTANEOUS | 2 refills | Status: DC

## 2022-05-19 NOTE — Patient Instructions (Addendum)
Intertrigo (rash under breasts and pink scaly rash on face)  Mix hydrocortisone with ketaconazole 2% twice a day. If improved, decrease to hydrocortisone and ketaconazole mixed once a day. If still clear, decrease to ketaconazole only. Once rash under breasts improved, start Zeasorb AF powder (over the counter).  Cryotherapy Aftercare  Wash gently with soap and water everyday.   Apply Vaseline and Band-Aid daily until healed.   Due to recent changes in healthcare laws, you may see results of your pathology and/or laboratory studies on MyChart before the doctors have had a chance to review them. We understand that in some cases there may be results that are confusing or concerning to you. Please understand that not all results are received at the same time and often the doctors may need to interpret multiple results in order to provide you with the best plan of care or course of treatment. Therefore, we ask that you please give [place] 2 business days to thoroughly review all your results before contacting the office for clarification. Should we see a critical lab result, you will be contacted sooner.   If You Need Anything After Your Visit  If you have any questions or concerns for your doctor, please call our main line at [phone] and press option 4 to reach your doctor's medical assistant. If no one answers, please leave a voicemail as directed and we will return your call as soon as possible. Messages left after 4 pm will be answered the following business day.   You may also send [place] a message via [place]. We typically respond to MyChart messages within 1-2 business days.  For prescription refills, please ask your pharmacy to contact our office. Our fax number is [phone].  If you have an urgent issue when the clinic is closed that cannot wait until the next business day, you can page your doctor at the number below.    Please note that while we do our best to be available for urgent  issues outside of office hours, we are not available 24/7.   If you have an urgent issue and are unable to reach [place], you may choose to seek medical care at your doctor's office, retail clinic, urgent care center, or emergency room.  If you have a medical emergency, please immediately call 911 or go to the emergency department.  Pager Numbers  - Dr. Nehemiah Massed: [phone]  - Dr. Laurence Ferrari: [phone]  - Dr. Nicole Kindred: [phone]  In the event of inclement weather, please call our main line at [phone] for an update on the status of any delays or closures.  Dermatology Medication Tips: Please keep the boxes that topical medications come in in order to help keep track of the instructions about where and how to use these. Pharmacies typically print the medication instructions only on the boxes and not directly on the medication tubes.   If your medication is too expensive, please contact our office at [phone] option 4 or send [place] a message through [place].   We are unable to tell what your co-pay for medications will be in advance as this is different depending on your insurance coverage. However, we may be able to find a substitute medication at lower cost or fill out paperwork to get insurance to cover a needed medication.   If a prior authorization is required to get your medication covered by your insurance company, please allow [place] 1-2 business days to complete this process.  Drug prices often vary depending on where the  prescription is filled and some pharmacies may offer cheaper prices.  The website www.goodrx.com contains coupons for medications through different pharmacies. The prices here do not account for what the cost may be with help from insurance (it may be cheaper with your insurance), but the website can give you the price if you did not use any insurance.  - You can print the associated coupon and take it with your prescription to the pharmacy.  - You may also stop by  our office during regular business hours and pick up a GoodRx coupon card.  - If you need your prescription sent electronically to a different pharmacy, notify our office through [facility] or by phone at [phone] option 4.     Si Usted Necesita Algo Despus de Su Visita  Tambin puede enviarnos un mensaje a travs de Pharmacist, community. Por lo general respondemos a los mensajes de MyChart en el transcurso de 1 a 2 das hbiles.  Para renovar recetas, por favor pida a su farmacia que se ponga en contacto con nuestra oficina. Harland Dingwall de fax es [place] [phone].  Si tiene un asunto urgente cuando la clnica est cerrada y que no puede esperar hasta el siguiente da hbil, puede llamar/localizar a su doctor(a) al nmero que aparece a continuacin.   Por favor, tenga en cuenta que aunque hacemos todo lo posible para estar disponibles para asuntos urgentes fuera del horario de [place], no estamos disponibles las 24 horas del da, los 7 das de la [place].   Si tiene un problema urgente y no puede comunicarse con nosotros, puede optar por buscar atencin mdica  en el consultorio de su doctor(a), en una clnica privada, en un centro de atencin urgente o en una sala de emergencias.  Si tiene Engineering geologist, por favor llame inmediatamente al 911 o vaya a la sala de emergencias.  Nmeros de bper  - Dr. Nehemiah Massed: [phone]  - Dra. Moye: [phone]  - Dra. Nicole Kindred: [phone]  En caso de inclemencias del [place], por favor llame a Johnsie Kindred principal al [phone] para una actualizacin sobre el [place] de cualquier retraso o cierre.  Consejos para la medicacin en dermatologa: Por favor, guarde las cajas en las que vienen los medicamentos de uso tpico para ayudarle a seguir las instrucciones sobre dnde y cmo usarlos. Las farmacias generalmente imprimen las instrucciones del medicamento slo en las cajas y no directamente en los tubos del [place].   Si su  medicamento es muy caro, por favor, pngase en contacto con Zigmund Daniel llamando al [phone] y presione la opcin 4 o envenos un mensaje a travs de Pharmacist, community.   No podemos decirle cul ser su copago por los medicamentos por adelantado ya que esto es diferente dependiendo de la cobertura de su seguro. Sin embargo, es posible que podamos encontrar un medicamento sustituto a Electrical engineer un formulario para que el seguro cubra el medicamento que se considera necesario.   Si se requiere una autorizacin previa para que su compaa de seguros [place] su medicamento, por favor permtanos de 1 a 2 das hbiles para completar este proceso.  Los precios de los medicamentos varan con frecuencia dependiendo del Environmental consultant de dnde se surte la receta y alguna farmacias pueden ofrecer precios ms baratos.  El sitio web www.goodrx.com tiene cupones para medicamentos de Airline pilot. Los precios aqu no tienen en cuenta lo que podra costar con la ayuda del seguro (puede ser ms barato con su seguro), pero el sitio web  puede darle el precio si no utiliz [employer].  - Puede imprimir el cupn correspondiente y llevarlo con su receta a la farmacia.  - Tambin puede pasar por nuestra oficina durante el horario de atencin regular y Charity fundraiser una tarjeta de cupones de GoodRx.  - Si necesita que su receta se enve electrnicamente a una farmacia diferente, informe a nuestra oficina a travs de MyChart de  o por telfono llamando al [phone] y presione la opcin 4.

## 2022-05-19 NOTE — Progress Notes (Signed)
Follow-Up Visit   Subjective  Yolanda Wade is a 88 y.o. female who presents for the following: Follow-up.  Patient presents for 6 month follow-up. She has a history of BCC of the left neck bx/edc 09/22/21. She also has a few spots on her face and back she would like checked. She picks at spot on her forehead.  The following portions of the chart were reviewed this encounter and updated as appropriate:       Review of Systems:  No other skin or systemic complaints except as noted in HPI or Assessment and Plan.  Objective  Well appearing patient in no apparent distress; mood and affect are within normal limits.  A focused examination was performed including face, neck. Relevant physical exam findings are noted in the Assessment and Plan.  left neck Well healed scar with no evidence of recurrence.   inframammary Pink patches  glabella, nasolabial, eyebrows Pink patches with greasy scale.   R upper forehead Erythematous stuck-on, waxy papule     Assessment & Plan  Actinic Damage - chronic, secondary to cumulative UV radiation exposure/sun exposure over time - diffuse scaly erythematous macules with underlying dyspigmentation - Recommend daily broad spectrum sunscreen SPF 30+ to sun-exposed areas, reapply every 2 hours as needed.  - Recommend staying in the shade or wearing long sleeves, sun glasses (UVA+UVB protection) and wide brim hats (4-inch brim around the entire circumference of the hat). - Call for new or changing lesions.  History of basal cell carcinoma (BCC) left neck  Clear. Observe for recurrence. Call clinic for new or changing lesions.  Recommend regular skin exams, daily broad-spectrum spf 30+ sunscreen use, and photoprotection.    Erythema intertrigo inframammary  Chronic and persistent condition with duration or expected duration over one year. Condition is bothersome/symptomatic for patient. Currently flared.   Intertrigo is a chronic recurrent  rash that occurs in skin fold areas that may be associated with friction; heat; moisture; yeast; fungus; and bacteria.  It is exacerbated by increased movement / activity; sweating; and higher atmospheric temperature.  Start hydrocortisone 2.5% cream Apply qd/bid as directed dsp 30g 2Rf Start ketoconazole 2% cream Apply qd/bid as directed dsp 60g 2Rf Start Zeasorb AF powder qam for maintenance.   hydrocortisone 2.5 % cream - inframammary Apply to rash under breasts once to twice daily as directed.  ketoconazole (NIZORAL) 2 % cream - inframammary Apply to rash under breasts once to twice daily as directed.  Seborrheic dermatitis glabella, nasolabial, eyebrows  Chronic and persistent condition with duration or expected duration over one year. Condition is bothersome/symptomatic for patient. Mild flare today.   Seborrheic Dermatitis  -  is a chronic persistent rash characterized by pinkness and scaling most commonly of the mid face but also can occur on the scalp (dandruff), ears; mid chest, mid back and groin.  It tends to be exacerbated by stress and cooler weather.  People who have neurologic disease may experience new onset or exacerbation of existing seborrheic dermatitis.  The condition is not curable but treatable and can be controlled.  Start hydrocortisone 2.5% cream Apply qd/bid as directed.  Start ketoconazole 2% cream Apply qd/bid as directed.   Inflamed seborrheic keratosis R upper forehead  Symptomatic, irritating, patient would like treated.  Destruction of lesion - R upper forehead  Destruction method: cryotherapy   Informed consent: discussed and consent obtained   Lesion destroyed using liquid nitrogen: Yes   Region frozen until ice ball extended beyond lesion: Yes  Outcome: patient tolerated procedure well with no complications   Post-procedure details: wound care instructions given   Additional details:  Prior to procedure, discussed risks of blister formation,  small wound, skin dyspigmentation, or rare scar following cryotherapy. Recommend Vaseline ointment to treated areas while healing.   Lentigines - Scattered tan macules back - Due to sun exposure - Benign-appearing, observe - Recommend daily broad spectrum sunscreen SPF 30+ to sun-exposed areas, reapply every 2 hours as needed. - Call for any changes  Hemangiomas - Red papules back - Discussed benign nature - Observe - Call for any changes  Seborrheic Keratoses - Stuck-on, waxy, tan-brown papules and/or plaques back - Benign-appearing - Discussed benign etiology and prognosis. - Observe - Call for any changes  Return in about 6 months (around 11/18/2022) for Hx BCC.  IJamesetta Orleans, CMA, am acting as scribe for Brendolyn Patty, MD .  Documentation: I have reviewed the above documentation for accuracy and completeness, and I agree with the above.  Brendolyn Patty MD

## 2022-06-01 ENCOUNTER — Other Ambulatory Visit: Payer: Self-pay | Admitting: Orthopedic Surgery

## 2022-06-01 DIAGNOSIS — S22080A Wedge compression fracture of T11-T12 vertebra, initial encounter for closed fracture: Secondary | ICD-10-CM

## 2022-06-01 DIAGNOSIS — S32040A Wedge compression fracture of fourth lumbar vertebra, initial encounter for closed fracture: Secondary | ICD-10-CM

## 2022-06-11 IMAGING — CT CT HEAD W/O CM
4 series · 16 of 47 positions shown, 18 images · non-contrast
Comparison: None.

CLINICAL DATA: Head trauma, moderate-severe; Facial trauma, blunt

EXAM:
CT HEAD WITHOUT CONTRAST
CT MAXILLOFACIAL WITHOUT CONTRAST
TECHNIQUE: Multidetector CT imaging of the head and maxillofacial structures
were performed using the standard protocol without intravenous
contrast. Multiplanar CT image reconstructions of the maxillofacial
structures were also generated.
RADIATION DOSE REDUCTION: This exam was performed according to the
departmental dose-optimization program which includes automated
exposure control, adjustment of the mA and/or kV according to
patient size and/or use of iterative reconstruction technique.

[Series 2: head wo · axial · 0.41mm/px · z∈[-95,+25]mm · 7 of 33 slices shown, 9 images]
[im 5/33  brain]
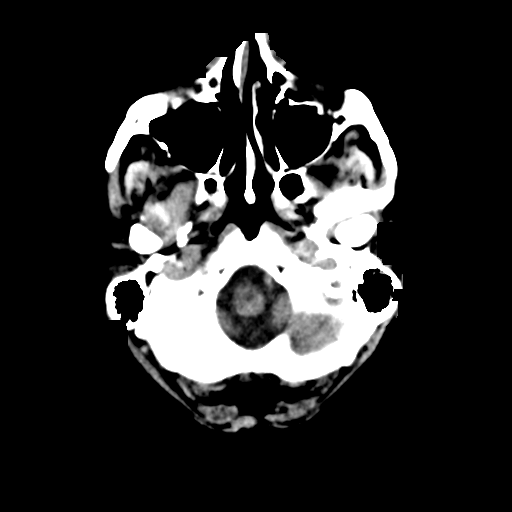
[im 5/33  bone]
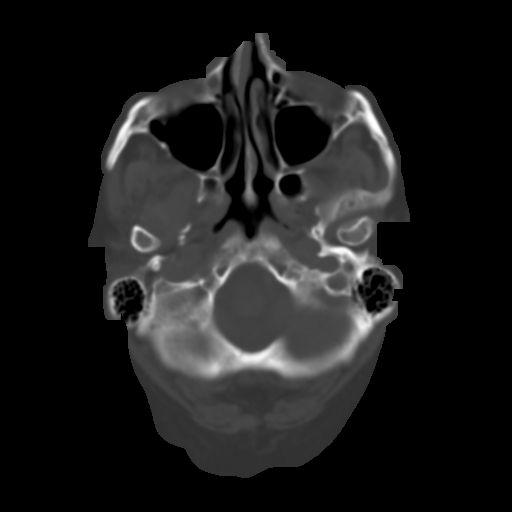
[im 9/33  brain]
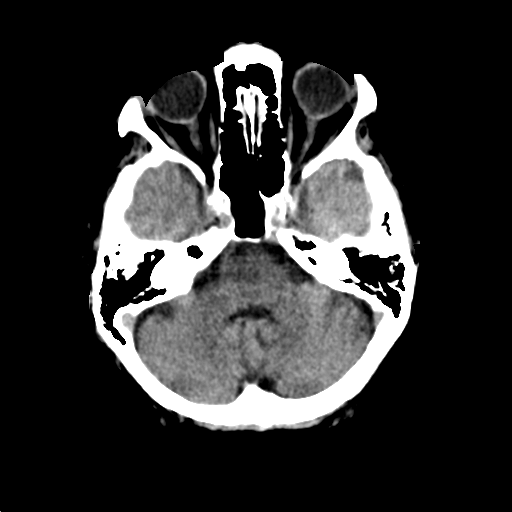
[im 13/33  brain]
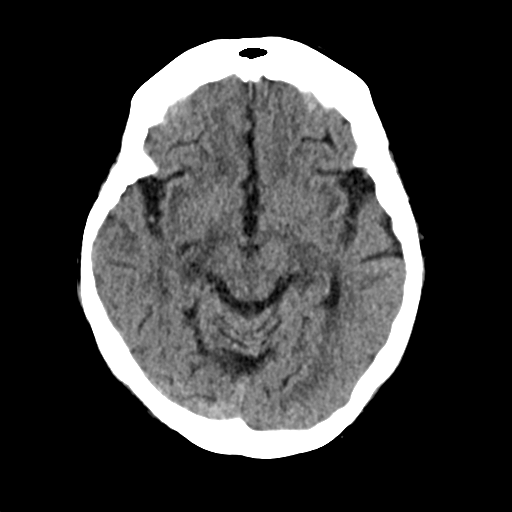
[im 17/33  brain]
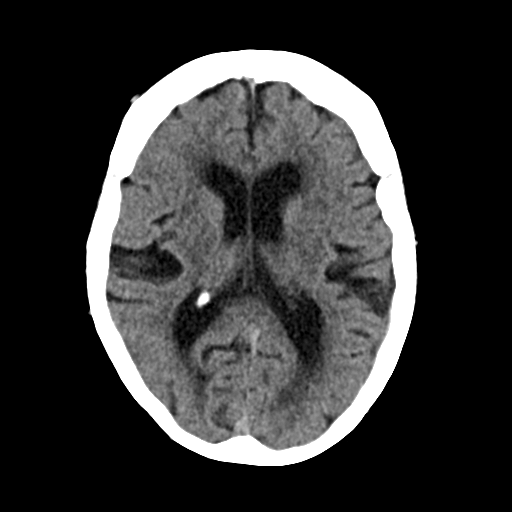
[im 21/33  brain]
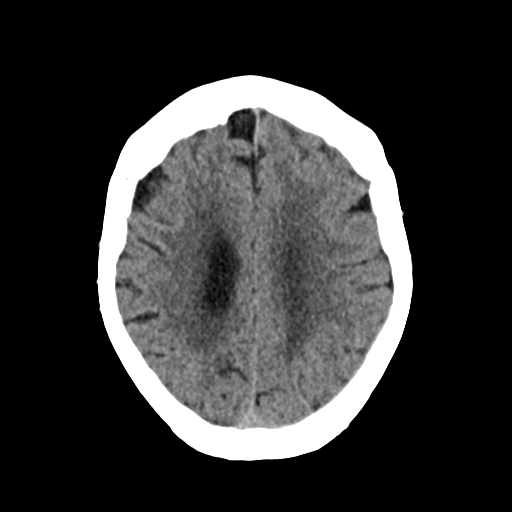
[im 21/33  bone]
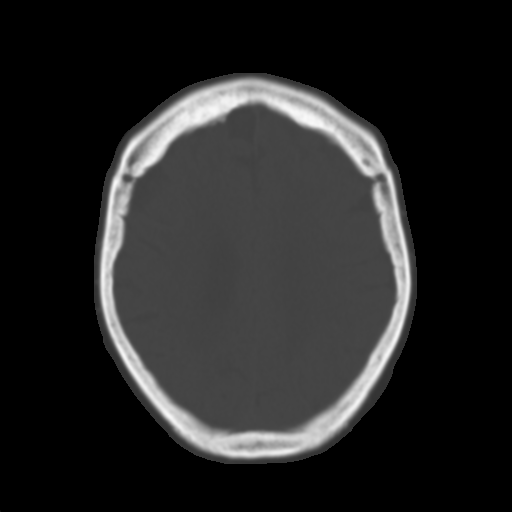
[im 25/33  brain]
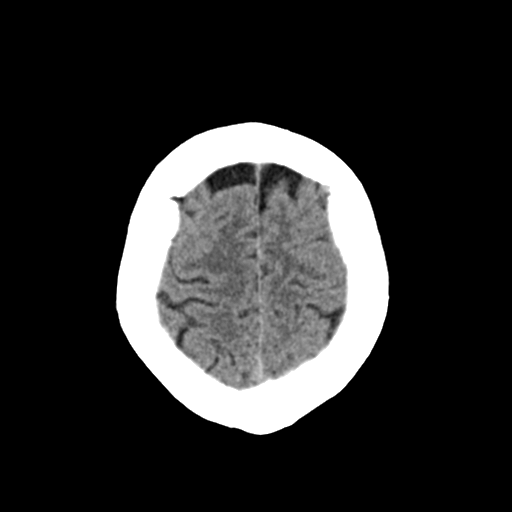
[im 29/33  brain]
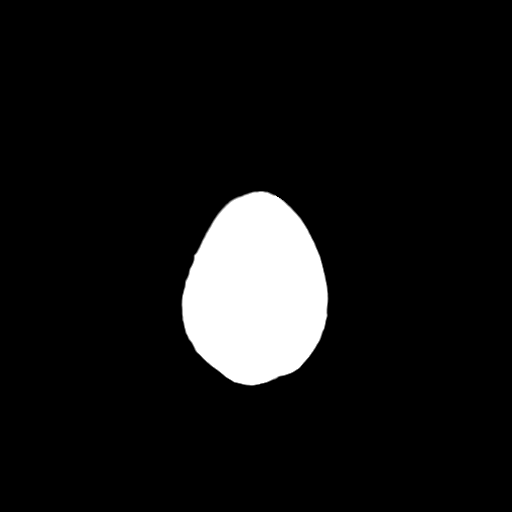

[Series 3: head bone · axial · 0.41mm/px · z∈[-99,-67]mm · 3 of 81 slices shown]
[im 9/81  bone]
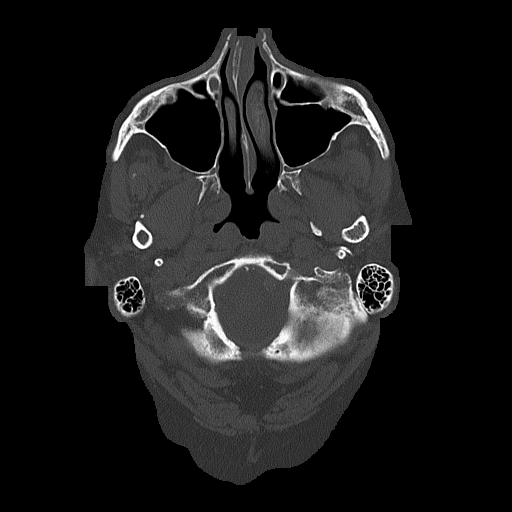
[im 17/81  bone]
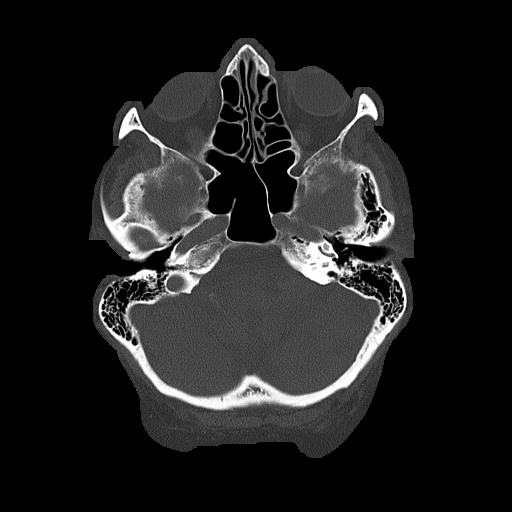
[im 25/81  bone]
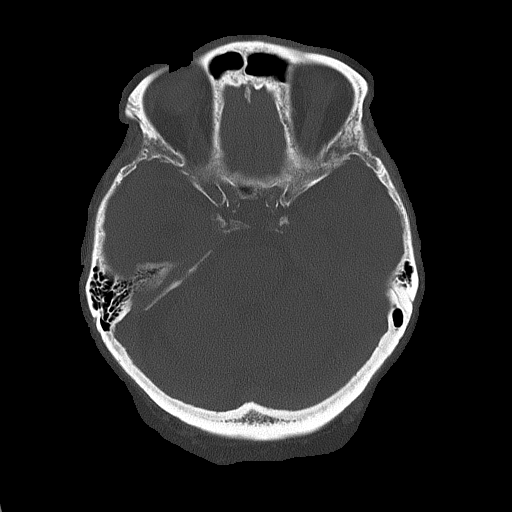

[Series 4: coronal soft tissue · coronal · 0.32mm/px · 3 of 70 slices shown]
[im 24/70  brain]
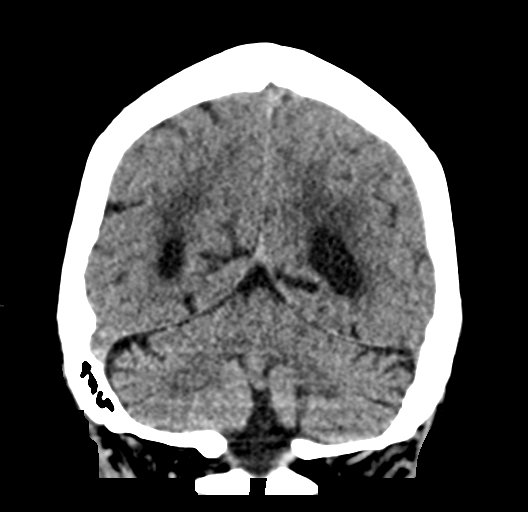
[im 31/70  brain]
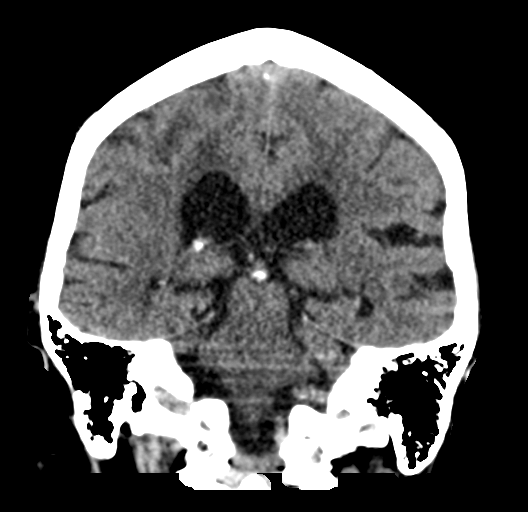
[im 39/70  brain]
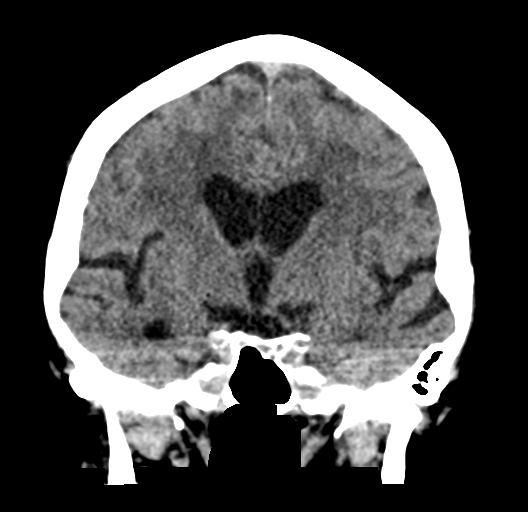

[Series 5: sagittal soft tissue · sagittal · 0.32mm/px · 3 of 57 slices shown]
[im 19/57  brain]
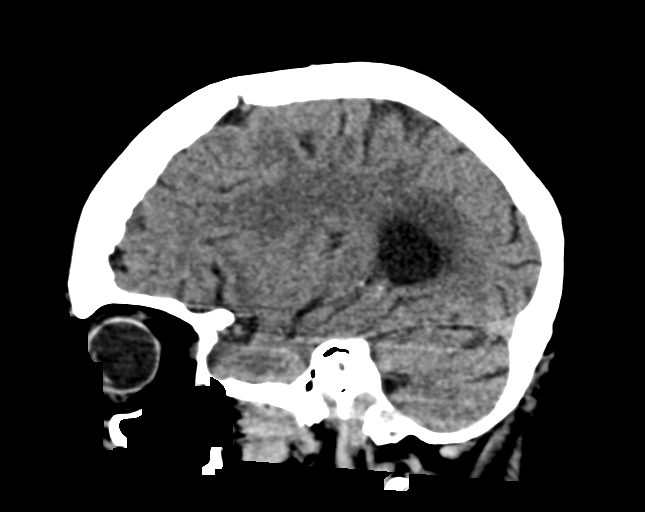
[im 29/57  brain]
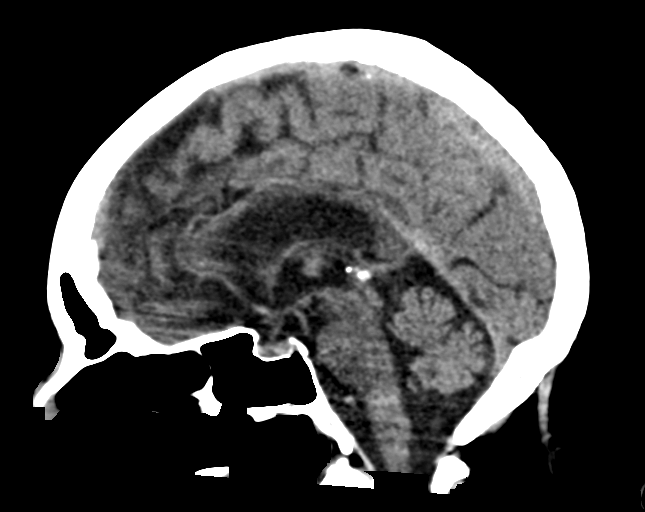
[im 38/57  brain]
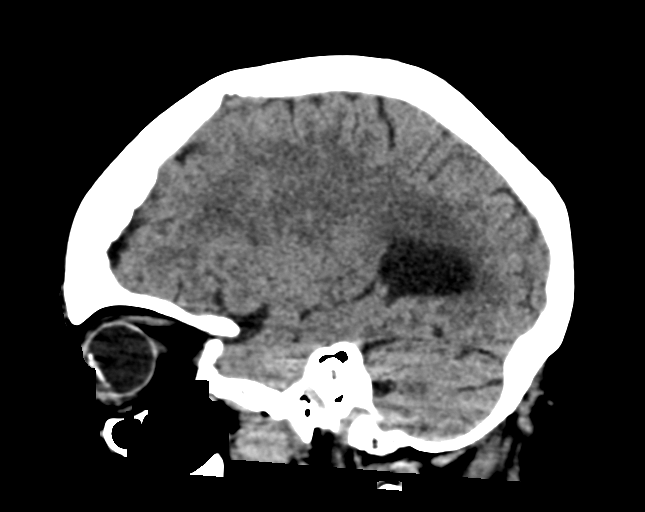

[16 of 47 positions shown; findings below may reference images not displayed]

FINDINGS: CT HEAD FINDINGS

Brain: No acute intracranial hemorrhage, mass effect, or edema.
Prominence of the ventricles and sulci reflects mild parenchymal
volume loss. Patchy and confluent hypoattenuation in the
supratentorial white matter is nonspecific but may reflect moderate
chronic microvascular ischemic changes. No extra-axial collection.

Vascular: There is intracranial atherosclerotic calcification at the
skull base.

Skull: Unremarkable.

Other: Mastoid air cells are clear.

CT MAXILLOFACIAL FINDINGS

Osseous: Mild nasal bone irregularity, left greater than right.
Facial bones are otherwise intact.

Orbits: No intraorbital hematoma.

Sinuses: Minor mucosal thickening.

Soft tissues: Mild paranasal soft tissue swelling.
IMPRESSION: No evidence of acute intracranial injury. Chronic microvascular
ischemic changes.

Age-indeterminate mild nasal bone irregularity may reflect acute
nondisplaced fracture(s).

## 2022-06-11 IMAGING — CT CT CERVICAL SPINE W/O CM
3 of 4 series · 12 of 33 positions shown, 14 images · non-contrast
Comparison: None.

CLINICAL DATA: Neck trauma (Age >= 65y)



[Series 5: orthogonal axials · axial · 0.27mm/px · z∈[-291,-167]mm · 4 of 109 slices shown, 5 images]
[im 19/109  soft-tissue]
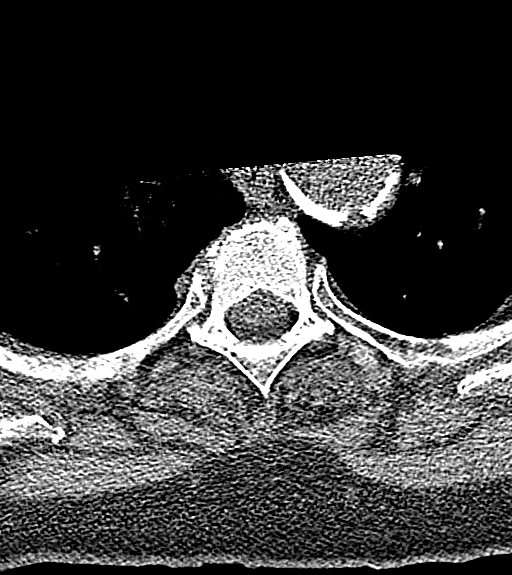
[im 19/109  bone]
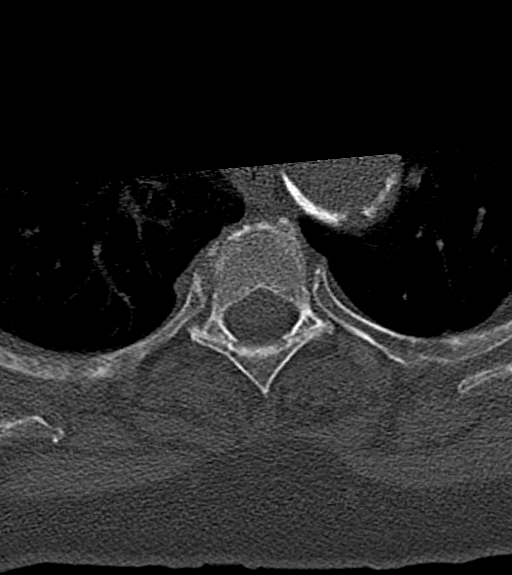
[im 37/109  bone]
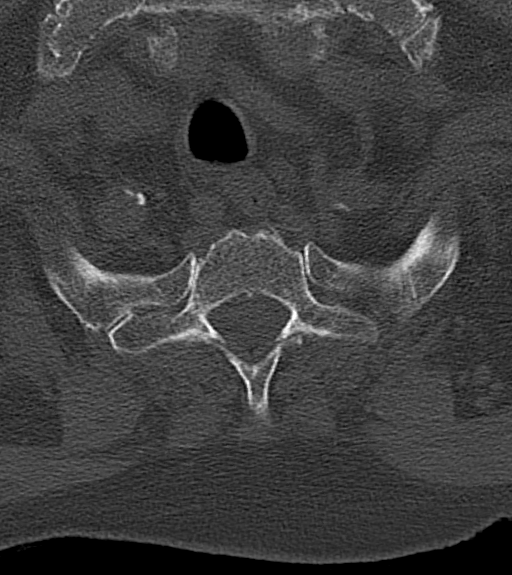
[im 73/109  bone]
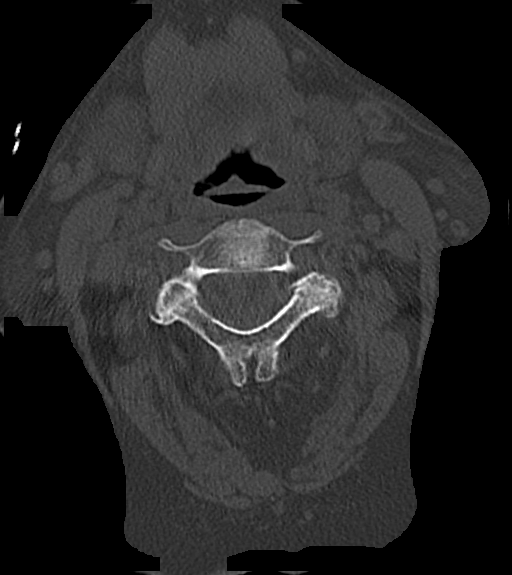
[im 91/109  bone]
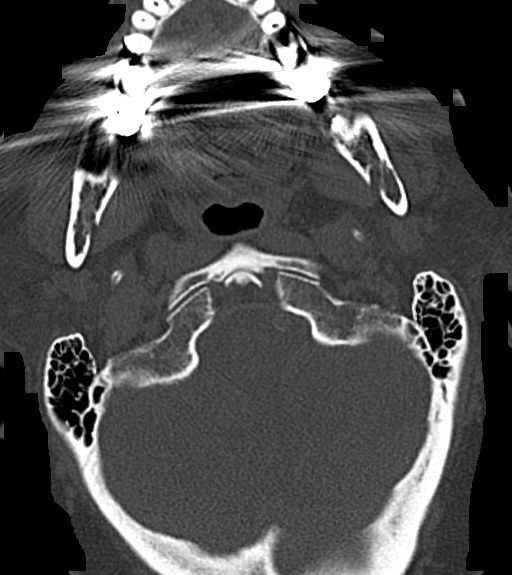

[Series 6: sagittal bone · sagittal · 0.30mm/px · 5 of 71 slices shown, 6 images]
[im 24/71  bone]
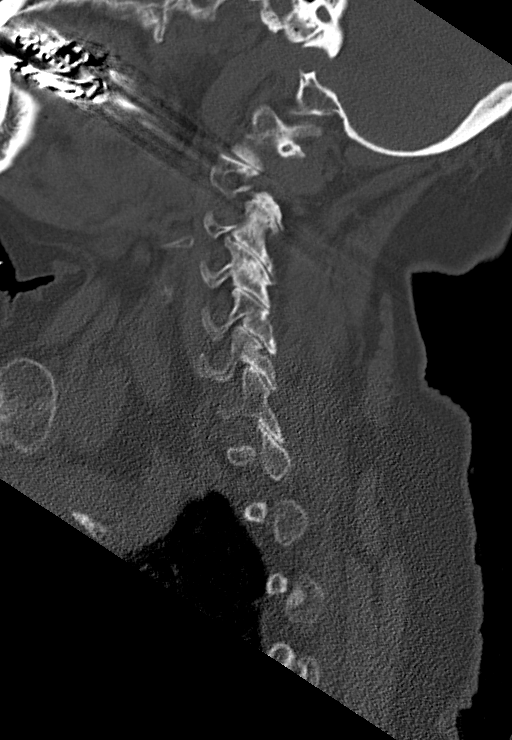
[im 30/71  bone]
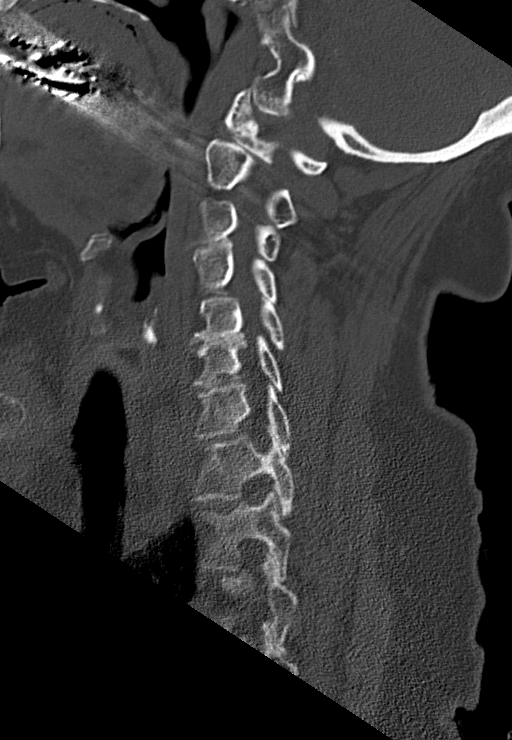
[im 36/71  soft-tissue]
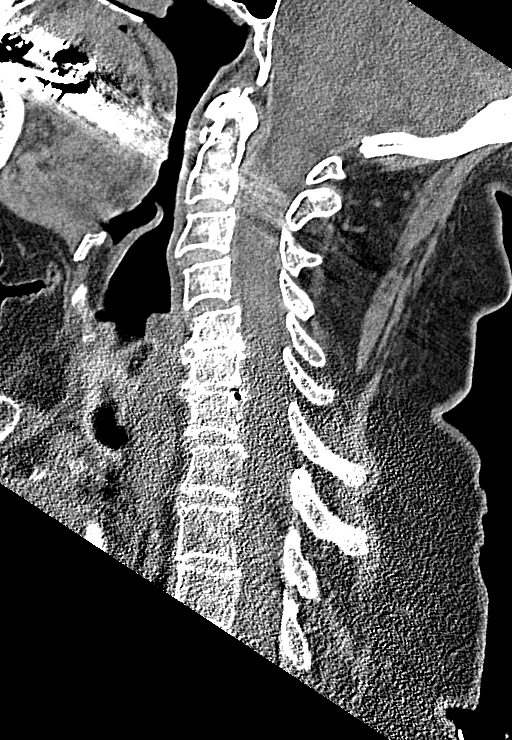
[im 36/71  bone]
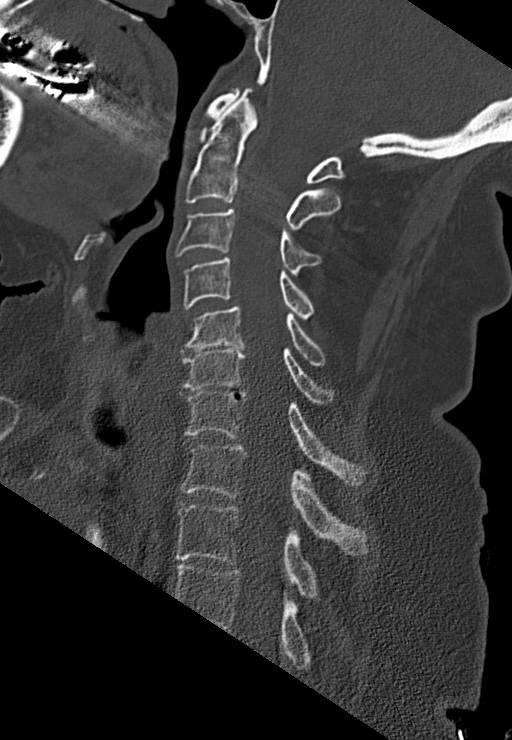
[im 41/71  bone]
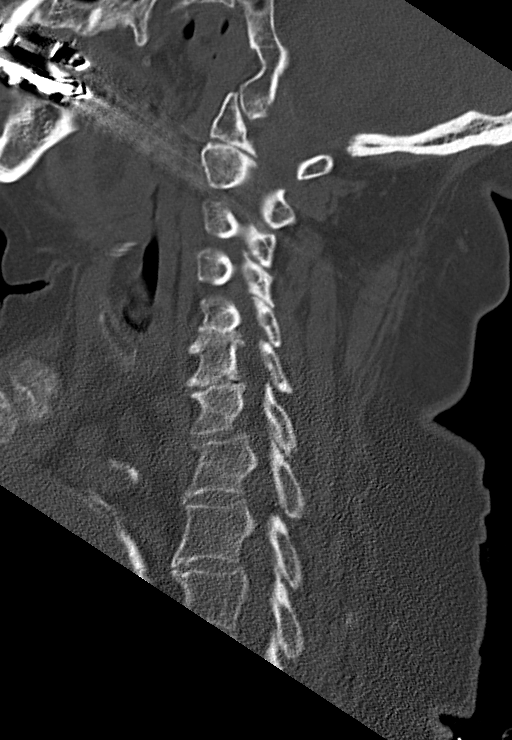
[im 47/71  bone]
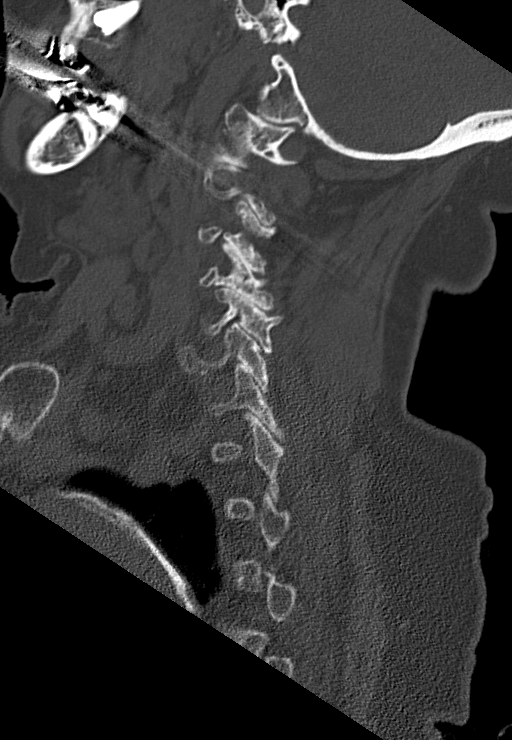

[Series 7: coronal bone · coronal · 0.29mm/px · 3 of 63 slices shown]
[im 17/63  bone]
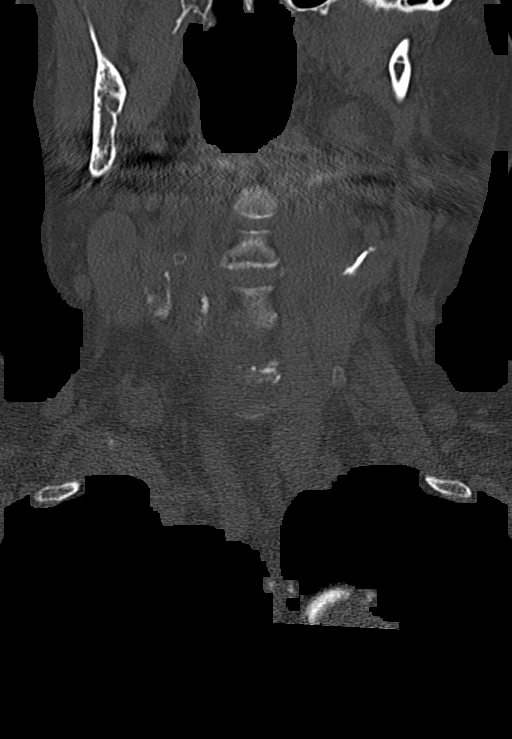
[im 27/63  bone]
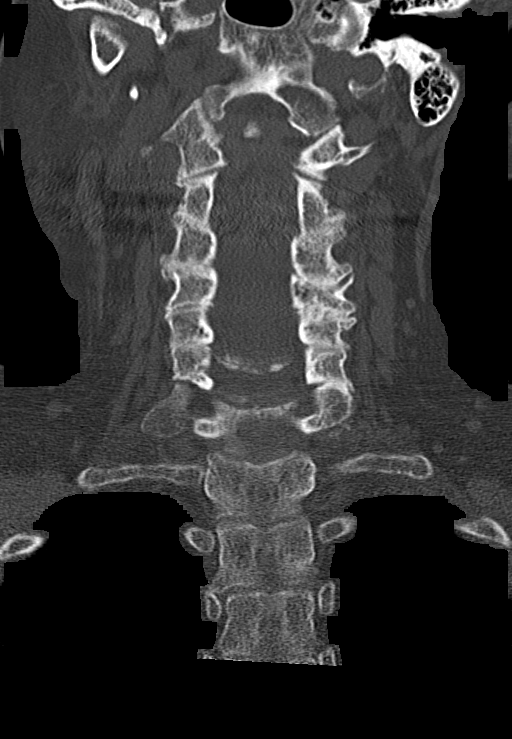
[im 36/63  bone]
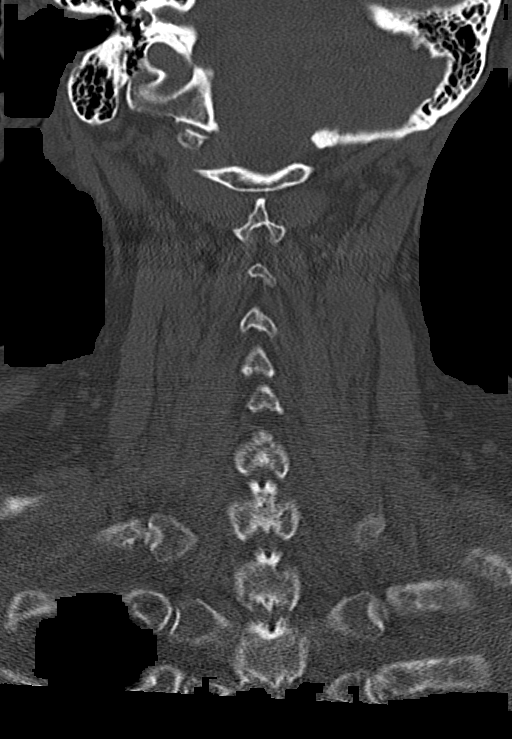

[12 of 33 positions shown; findings below may reference images not displayed]

FINDINGS: Alignment: Mild anterolisthesis at C4-C5.

Skull base and vertebrae: Vertebral body heights are maintained.
There is degenerative endplate irregularity primarily at lower
cervical levels. No acute fracture.

Soft tissues and spinal canal: No prevertebral fluid or swelling. No
visible canal hematoma.

Disc levels: Multilevel degenerative changes are present including
disc space narrowing, endplate osteophytes, and facet and
uncovertebral hypertrophy.

Upper chest: No apical lung mass.

Other: Calcified plaque at the common carotid bifurcations.
IMPRESSION: No acute cervical spine fracture.

## 2022-06-15 ENCOUNTER — Ambulatory Visit
Admission: RE | Admit: 2022-06-15 | Discharge: 2022-06-15 | Disposition: A | Discharge status: Home / Self Care | Payer: Medicare Other | Source: Ambulatory Visit | Attending: Orthopedic Surgery | Admitting: Orthopedic Surgery

## 2022-06-15 DIAGNOSIS — S32040A Wedge compression fracture of fourth lumbar vertebra, initial encounter for closed fracture: Secondary | ICD-10-CM | POA: Insufficient documentation

## 2022-06-15 DIAGNOSIS — S22080A Wedge compression fracture of T11-T12 vertebra, initial encounter for closed fracture: Secondary | ICD-10-CM

## 2022-06-24 ENCOUNTER — Other Ambulatory Visit: Payer: Self-pay | Admitting: Orthopedic Surgery

## 2022-06-24 DIAGNOSIS — S32040A Wedge compression fracture of fourth lumbar vertebra, initial encounter for closed fracture: Secondary | ICD-10-CM

## 2022-07-06 ENCOUNTER — Ambulatory Visit
Admission: RE | Admit: 2022-07-06 | Discharge: 2022-07-06 | Disposition: A | Discharge status: Home / Self Care | Payer: Medicare Other | Source: Ambulatory Visit | Attending: Orthopedic Surgery | Admitting: Orthopedic Surgery

## 2022-07-06 DIAGNOSIS — S32040A Wedge compression fracture of fourth lumbar vertebra, initial encounter for closed fracture: Secondary | ICD-10-CM

## 2022-07-06 HISTORY — PX: IR RADIOLOGIST EVAL & MGMT: IMG5224

## 2022-07-06 NOTE — Consult Note (Signed)
Chief Complaint: Patient was seen in consultation today for lumbar spine pain at the request of [place] [facility]  Referring Physician(s): Dorise Hiss C  History of Present Illness: MADILYNN MONTANTE is a 88 y.o. female who presents at the kind request of Dr. Arvella Nigh for further evaluation of her subacute L4 compression fracture.  Approximately 1 month ago she developed relatively rapid onset of low back pain.  Her pain is most significant when she is standing and working about the house or kitchen.  She has no pain while seated or laying down.  An MRI of the thoracic and lumbar spine was performed which demonstrated an acute/subacute L4 compression fracture.  She has been taking Tylenol, ibuprofen and Aleve over-the-counter as needed for pain.  She reports that she was taking these medications quite frequently when her pain first began but over the last week she has been only taken a couple of Tylenol.  She reports that her pain is modest while standing registering up to a 5.  Her pain is slightly disabling.  She scored a 10 out of 24 on the [employer] disability questionnaire.  In particular, her back gets sore enough where she has to sit down and rest and she has some concern that she will not be able to do everything she wants to do for the upcoming holiday season such as making cookies.  She denies lower extremity weakness, difficulty sleeping, paresthesias or other systemic symptoms.  Past Medical History:  Diagnosis Date   Basal cell carcinoma 09/22/2021   L neck, EDC   Chicken pox    Hyperlipidemia    Hypertension    Hypothyroidism    Osteopenia    Thyroid cancer ([place]) 06/2017   per patient    Past Surgical History:  Procedure Laterality Date   CARPAL TUNNEL RELEASE Right    CATARACT EXTRACTION Bilateral    DILATION AND CURETTAGE OF UTERUS     for post meopausal bleeding   EYE SURGERY     HALLUX VALGUS [place]     KNEE CARTILAGE SURGERY Right    UMBILICAL HERNIA  REPAIR N/A 05/23/2015   Procedure: HERNIA REPAIR UMBILICAL ADULT;  Surgeon: Leonie Green, MD;  Location: [facility] ORS;  Service: General;  Laterality: N/A;    Allergies: Patient has no known allergies.  Medications: Prior to Admission medications   Medication Sig Start Date End Date Taking? Authorizing Provider  atorvastatin (LIPITOR) 20 MG tablet Take 20 mg by mouth daily.   Yes [provider]  levothyroxine (SYNTHROID, LEVOTHROID) 75 MCG tablet Take 75 mcg by mouth daily before breakfast.   Yes [provider]  lisinopril-hydrochlorothiazide (PRINZIDE,ZESTORETIC) 10-12.5 MG tablet Take 1 tablet by mouth daily.   Yes [provider]  Calcium-Vitamin D-Vitamin K (VIACTIV PO) Take 2 Doses by mouth daily. Patient not taking: Reported on 07/06/2022    [provider]  hydrocortisone 2.5 % cream Apply to rash under breasts once to twice daily as directed. Patient not taking: Reported on 07/06/2022 05/19/22   Brendolyn Patty, MD  ketoconazole (NIZORAL) 2 % cream Apply to rash under breasts once to twice daily as directed. Patient not taking: Reported on 07/06/2022 05/19/22   Brendolyn Patty, MD  Multiple Vitamins-Minerals (PRESERVISION AREDS PO) Take 1 tablet by mouth 2 (two) times daily. Patient not taking: Reported on 07/06/2022    [provider]     No family history on file.  Social History   Socioeconomic History   Marital status: Married  Spouse name: Not on file   Number of children: Not on file   Years of education: Not on file   Highest education level: Not on file  Occupational History   Not on file  Tobacco Use   Smoking status: Never   Smokeless tobacco: Never  Substance and Sexual Activity   Alcohol use: Yes    Comment: occasionally   Drug use: No   Sexual activity: Not on file  Other Topics Concern   Not on file  Social History Narrative   Not on file   Social Determinants of Health   Financial Resource Strain:  Not on file  Food Insecurity: Not on file  Transportation Needs: Not on file  Physical Activity: Not on file  Stress: Not on file  Social Connections: Not on file   Review of Systems: A 12 point ROS discussed and pertinent positives are indicated in the HPI above.  All other systems are negative.  Review of Systems  Vital Signs: BP (!) 182/107 (BP Location: Left Arm, Patient Position: Sitting, Cuff Size: Normal)   Pulse 72   Temp 97.7 F (36.5 C) (Oral)   Resp 14   SpO2 94%     Physical Exam Constitutional:      General: She is not in acute distress.    Appearance: Normal appearance.  HENT:     Head: Normocephalic and atraumatic.  Eyes:     General: No scleral icterus. Cardiovascular:     Rate and Rhythm: Normal rate.  Pulmonary:     Effort: Pulmonary effort is normal.  Abdominal:     General: There is no distension.     Tenderness: There is no abdominal tenderness.  Musculoskeletal:     Comments: No TTP over the lower lumbar spine.    Skin:    General: Skin is warm and dry.  Neurological:     Mental Status: She is alert and oriented to person, place, and time.  Psychiatric:        Mood and Affect: Mood normal.        Behavior: Behavior normal.       Imaging: MR LUMBAR SPINE WO CONTRAST  Result Date: 06/16/2022 CLINICAL DATA:  88 year old female with midline low back pain since an episode of heavy lifting in September. EXAM: MRI LUMBAR SPINE WITHOUT CONTRAST TECHNIQUE: Multiplanar, multisequence MR imaging of the lumbar spine was performed. No intravenous contrast was administered. COMPARISON:  Thoracic spine MRI from the same day reported separately. CT Abdomen and Pelvis 05/21/2016. FINDINGS: Segmentation: Normal, concordant with the thoracic spine numbering today. Alignment: Lumbar lordosis has not significantly changed since 2017. Chronic grade 1 anterolisthesis of L5 on S1. Increased grade 1 anterolisthesis of L4 on L5 since that time. And subtle new grade  1 anterolisthesis of L3 on L4. Vertebrae: Chronic severe T12 compression fracture. Thoracic spine is detailed separately today. Chronic L1 compression fracture is moderate to severe. Superior endplate deformity appears not significantly changed since 2017 with probable small superimposed Schmorl's node. No convincing marrow edema there. L2 remains intact.  Stable vertebral hemangioma on the left. L3 vertebral body appears intact although with mild inferior and posterior endplate marrow edema which appears reactive secondary to #4. L4 compression fracture with heterogeneous moderate to severe marrow edema and superior endplate deformity. L4 loss of vertebral body height up to 55%. But only mild retropulsion of the L4 posterosuperior endplate. Marrow edema tracks into the bilateral pedicles and posterior elements which appear to remain aligned. L5 and  the visible sacrum appear intact. Background bone marrow signal is heterogeneous but within normal limits for age. Conus medullaris and cauda equina: Conus extends to the L1-L2 level. Chronic mass effect on the ventral lower thoracic spinal cord at T12 as reported separately today. No lower spinal cord or conus signal abnormality. Paraspinal and other soft tissues: Large chronic pelvic/uterine mass in this patient, rounded and present in 2017 but appears increased by 1 or 2 cm since that time. As before this is indeterminate for severe fibroid uterus versus endometrial neoplasm. No lymphadenopathy identified at the pelvic inlet. Chronic tortuosity of the thoracoabdominal aorta at the diaphragm. Other visible abdominal viscera appear stable and negative for age. There is mild paraspinal edema bilaterally at L4. Disc levels: No upper lumbar spinal stenosis despite the chronic L1 compression. L3-L4: Moderate multifactorial spinal stenosis related to circumferential disc osteophyte complex, mild retropulsion of the L4 posterosuperior endplate, moderate facet and ligament  flavum hypertrophy. Mild right L3 foraminal stenosis. L4-L5: Moderate multifactorial spinal stenosis in large part related to grade 1 anterolisthesis with bulky circumferential disc/pseudo disc and moderate facet and ligament flavum hypertrophy. Mild bilateral L4 foraminal stenosis. L5-S1: Chronic grade 1 anterolisthesis with bulky circumferential disc/pseudo disc. Chronic moderate to severe multifactorial spinal stenosis with severe facet hypertrophy. Moderate bilateral L5 foraminal stenosis. IMPRESSION: 1. Acute to subacute L4 compression fracture with 55% loss of vertebral body height and mild retropulsion contributing to moderate multifactorial spinal stenosis at L3-L4 and L4-L5. 2. No other acute osseous abnormality in the lumbar spine. Chronic T12 and L1 compression fractures. Thoracic Spine MRI today is detailed separately today. 3. Moderate to severe multifactorial L5-S1 spinal stenosis in the setting of chronic spondylolisthesis there. 4. Large chronic pelvic/uterine mass appears increased by 1 or 2 cm since 2017. As before this is indeterminate for severe fibroid uterus versus an endometrial neoplasm. But in the setting of significant comorbidities or limited life expectancy, follow-up may not be valuable. Electronically Signed   By: Genevie Ann M.D.   On: 06/16/2022 08:53   MR THORACIC SPINE WO CONTRAST  Result Date: 06/16/2022 CLINICAL DATA:  88 year old female with midline low back pain since an episode of heavy lifting in September. EXAM: MRI THORACIC SPINE WITHOUT CONTRAST TECHNIQUE: Multiplanar, multisequence MR imaging of the thoracic spine was performed. No intravenous contrast was administered. COMPARISON:  Cervical spine CT 09/20/2021.  Chest CT 05/19/2017. Lumbar MRI today reported separately. FINDINGS: Limited cervical spine imaging: Grossly stable since the February CT. Thoracic spine segmentation:  Normal on the CT comparison 2018. Alignment: Essentially stable thoracic kyphosis since 2018,  chronic focal kyphosis related to chronic T12 compression fracture. No significant scoliosis or spondylolisthesis. Vertebrae: Thoracic vertebral marrow signal is heterogeneous but within normal limits for age. Benign T4 left posterior element hemangioma incidentally noted. A benign round T6 vertebral body hemangioma is also suspected, was vaguely apparent on the 2018 CT. Mild chronic T5 compression fracture is new since 2018, but without marrow edema. Chronic severe T12 compression fracture with anterior vertebra plana. This was present in 2018, evidence of some associated T11-T12 anterior interbody ankylosis since that time. Lumbar spine is detailed separately today. Cord: Normal except for mild chronic mass effect on the ventral lower thoracic cord related to T12 retropulsion (series 17, image 11 and series 20, image 35) with no associated cord edema or myelomalacia. Conus medullaris appears to remain normal at the L1-L2 level. Paraspinal and other soft tissues: Large thyroid goiter appears surgically absent now. Chronically tortuous and atherosclerotic thoracic  aorta appears grossly stable since 2018. Negative visible upper chest and upper abdominal viscera. Paraspinal soft tissues are within normal limits. Disc levels: No age advanced thoracic spine degeneration. Chronic T12 posterosuperior endplate retropulsion appears stable since the 2018 CT related to chronic severe anterior T12 wedge compression at that level. And there is associated chronic T11 neural foraminal stenosis. IMPRESSION: 1. No acute osseous abnormality in the thoracic spine. A severe chronic T12 compression fracture with retropulsion appears unchanged since 2018. A mild chronic T5 compression fracture is new since that time. 2. Lumbar Spine is reported separately today. 3. Chronically tortuous thoracic aorta. Electronically Signed   By: Genevie Ann M.D.   On: 06/16/2022 08:44    Labs:  CBC: No results for input(s): "WBC", "HGB", "HCT", "PLT"  in the last 8760 hours.  COAGS: No results for input(s): "INR", "APTT" in the last 8760 hours.  BMP: No results for input(s): "NA", "K", "CL", "CO2", "GLUCOSE", "BUN", "CALCIUM", "CREATININE", "GFRNONAA", "GFRAA" in the last 8760 hours.  Invalid input(s): "CMP"  LIVER FUNCTION TESTS: No results for input(s): "BILITOT", "AST", "ALT", "ALKPHOS", "PROT", "ALBUMIN" in the last 8760 hours.  TUMOR MARKERS: No results for input(s): "AFPTM", "CEA", "CA199", "CHROMGRNA" in the last 8760 hours.  Assessment and Plan:  Very pleasant 88 year old female with a subacute compression fracture at L4.  Her symptoms are relatively mild and are improving.  She only requires occasional use of Tylenol or Advil.  We discussed the natural history of compression fractures and the options of proceeding with cement augmentation versus allowing the fracture to continue to heal naturally.  After a comprehensive discussion on the options, risks and benefits, she has elected to proceed with a conservative approach for now.  On days where she is planning higher than normal activity I have advised her to take 2 Tylenol and 2 Advil in order to help combat her discomfort.  She will try this for 1 month and then we will schedule a follow-up evaluation.  If her pain persists or worsens, she may consider undergoing cement augmentation with balloon kyphoplasty.  Thank you for this interesting consult.  I greatly enjoyed meeting ABERDEEN HAFEN and look forward to participating in their care.  A copy of this report was sent to the requesting provider on this date.  Electronically Signed: Criselda Peaches 07/06/2022, 11:28 AM   I spent a total of 40 Minutes  in face to face in clinical consultation, greater than 50% of which was counseling/coordinating care for L4 compression fracture.

## 2022-07-15 ENCOUNTER — Other Ambulatory Visit: Payer: Self-pay | Admitting: Interventional Radiology

## 2022-07-15 DIAGNOSIS — S32040A Wedge compression fracture of fourth lumbar vertebra, initial encounter for closed fracture: Secondary | ICD-10-CM

## 2022-07-22 ENCOUNTER — Other Ambulatory Visit: Payer: Self-pay | Admitting: Orthopedic Surgery

## 2022-07-22 ENCOUNTER — Telehealth: Payer: Self-pay | Admitting: Interventional Radiology

## 2022-07-22 DIAGNOSIS — M8588 Other specified disorders of bone density and structure, other site: Secondary | ICD-10-CM

## 2022-07-22 NOTE — Telephone Encounter (Signed)
(  08:48) [email or web address] Balo Morning, I called  [name] 08/15/2025 [phone] to schedule a follow up phone call regarding the KP you did a consult for. She stated that she is in more pain and wants to do the KP. Do you still want to talk to her or go ahead and get her scheduled for the KP? (10:53) [email or web address]: we can just schedule her (10:54) jsanders: Ok I will let them know

## 2022-08-02 ENCOUNTER — Other Ambulatory Visit: Payer: Self-pay | Admitting: Orthopedic Surgery

## 2022-08-02 DIAGNOSIS — M8588 Other specified disorders of bone density and structure, other site: Secondary | ICD-10-CM

## 2022-08-03 ENCOUNTER — Other Ambulatory Visit: Payer: Self-pay | Admitting: Orthopedic Surgery

## 2022-08-03 ENCOUNTER — Ambulatory Visit
Admission: RE | Admit: 2022-08-03 | Discharge: 2022-08-03 | Disposition: A | Discharge status: Home / Self Care | Payer: Medicare Other | Source: Ambulatory Visit | Attending: Orthopedic Surgery | Admitting: Orthopedic Surgery

## 2022-08-03 DIAGNOSIS — M8588 Other specified disorders of bone density and structure, other site: Secondary | ICD-10-CM

## 2022-08-03 HISTORY — PX: IR RADIOLOGIST EVAL & MGMT: IMG5224

## 2022-08-03 HISTORY — PX: IR KYPHO LUMBAR INC FX REDUCE BONE BX UNI/BIL CANNULATION INC/IMAGING: IMG5519

## 2022-08-03 MED ORDER — MIDAZOLAM HCL 2 MG/2ML IJ SOLN
1.0000 mg | INTRAMUSCULAR | Status: DC | PRN
  Administered 2022-08-03: 1 mg via INTRAVENOUS
  Administered 2022-08-03: 0.5 mg via INTRAVENOUS

## 2022-08-03 MED ORDER — FENTANYL CITRATE PF 50 MCG/ML IJ SOSY
25.0000 ug | PREFILLED_SYRINGE | INTRAMUSCULAR | Status: DC | PRN
  Administered 2022-08-03 (×2): 25 ug via INTRAVENOUS

## 2022-08-03 MED ORDER — ACETAMINOPHEN 10 MG/ML IV SOLN
1000.0000 mg | Freq: Once | INTRAVENOUS | Status: AC
  Administered 2022-08-03: 1000 mg via INTRAVENOUS

## 2022-08-03 MED ORDER — SODIUM CHLORIDE 0.9 % IV SOLN: INTRAVENOUS | Status: DC

## 2022-08-03 MED ORDER — CEFAZOLIN SODIUM-DEXTROSE 2-4 GM/100ML-% IV SOLN
2.0000 g | INTRAVENOUS | Status: AC
  Administered 2022-08-03: 2 g via INTRAVENOUS

## 2022-08-03 NOTE — Discharge Instructions (Signed)
Kyphoplasty Post Procedure Discharge Instructions  May resume a regular diet and any medications that you routinely take (including pain medications). However, if you are taking Aspirin or an anticoagulant/blood thinner you will be told when you can resume taking these by the healthcare provider. No driving day of procedure. The day of your procedure take it easy. You may use an ice pack as needed to injection sites on back.  Ice to back 30 minutes on and 30 minutes off, as needed. May remove bandaids tomorrow after taking a shower. Replace daily with a clean bandaid until healed.  Do not lift anything heavier than a milk jug for 1-2 weeks or determined by your physician.  Follow up with your physician in 2 weeks.    Please contact our office at [phone] for the following symptoms or if you have any questions:  Fever greater than 100 degrees Increased swelling, pain, or redness at injection site. Increased back and/or leg pain New numbness or change in symptoms from before the procedure.    Thank you for visiting [facility]. 

## 2022-08-03 NOTE — Progress Notes (Signed)
Patient in recovery room after KP which she tolerated well. Eating and drinking withHOB elevated 30 degrees without issue. Husband at bedside. Dr. Laurence Ferrari in to speak with patient and her husband before discharge. Discharge instructions reviewed with patient and her husband. Escorted to car; gait slow but steady.

## 2022-08-03 NOTE — Progress Notes (Signed)
Chief Complaint: Patient was seen in consultation today for low back pain, L4 fracture at the request of [place] C  Referring Physician(s): Gaines,Thomas C  History of Present Illness: Yolanda Wade is a 89 y.o. female who I initially saw on 07/06/2022 for evaluation of a subacute osteoporotic compression fracture of L4.  At that time, her symptoms were fairly mild and we elected an initial conservative course of therapy.  However, she has since called back to the office and reported increasing pain.  I scheduled her to come in today for evaluation.  Her pain is increasing and is becoming more of a nuisance.  Her ability to perform her activities of daily living is also decreased.  She has worsening tenderness to palpation in the region of L4.  We will proceed with cement augmentation using balloon kyphoplasty.     Past Medical History:  Diagnosis Date   Basal cell carcinoma 09/22/2021   L neck, [facility]   Chicken pox    Hyperlipidemia    Hypertension    Hypothyroidism    Osteopenia    Thyroid cancer ([place]) 06/2017   per patient    Past Surgical History:  Procedure Laterality Date   CARPAL TUNNEL RELEASE Right    CATARACT EXTRACTION Bilateral    DILATION AND CURETTAGE OF UTERUS     for post meopausal bleeding   EYE SURGERY     HALLUX VALGUS [place]     IR RADIOLOGIST EVAL & MGMT  07/06/2022   KNEE CARTILAGE SURGERY Right    UMBILICAL HERNIA REPAIR N/A 05/23/2015   Procedure: HERNIA REPAIR UMBILICAL ADULT;  Surgeon: Leonie Green, MD;  Location: [facility] ORS;  Service: General;  Laterality: N/A;    Allergies: Patient has no known allergies.  Medications: Prior to Admission medications   Medication Sig Start Date End Date Taking? Authorizing Provider  atorvastatin (LIPITOR) 20 MG tablet Take 20 mg by mouth daily.    [provider]  Calcium-Vitamin D-Vitamin K (VIACTIV PO) Take 2 Doses by mouth daily. Patient not taking: Reported on 07/06/2022     [provider]  hydrocortisone 2.5 % cream Apply to rash under breasts once to twice daily as directed. Patient not taking: Reported on 07/06/2022 05/19/22   Brendolyn Patty, MD  ketoconazole (NIZORAL) 2 % cream Apply to rash under breasts once to twice daily as directed. Patient not taking: Reported on 07/06/2022 05/19/22   Brendolyn Patty, MD  levothyroxine (SYNTHROID, LEVOTHROID) 75 MCG tablet Take 75 mcg by mouth daily before breakfast.    [provider]  lisinopril-hydrochlorothiazide (PRINZIDE,ZESTORETIC) 10-12.5 MG tablet Take 1 tablet by mouth daily.    [provider]  Multiple Vitamins-Minerals (PRESERVISION AREDS PO) Take 1 tablet by mouth 2 (two) times daily. Patient not taking: Reported on 07/06/2022    [provider]     No family history on file.  Social History   Socioeconomic History   Marital status: Married    Spouse name: Not on file   Number of children: Not on file   Years of education: Not on file   Highest education level: Not on file  Occupational History   Not on file  Tobacco Use   Smoking status: Never   Smokeless tobacco: Never  Substance and Sexual Activity   Alcohol use: Yes    Comment: occasionally   Drug use: No   Sexual activity: Not on file  Other Topics Concern   Not on file  Social History Narrative  Not on file   Social Determinants of Health   Financial Resource Strain: Not on file  Food Insecurity: Not on file  Transportation Needs: Not on file  Physical Activity: Not on file  Stress: Not on file  Social Connections: Not on file   Review of Systems: A 12 point ROS discussed and pertinent positives are indicated in the HPI above.  All other systems are negative.  Review of Systems  Vital Signs: There were no vitals taken for this visit.    Physical Exam Constitutional:      General: She is not in acute distress.    Appearance: Normal appearance.  HENT:     Head: Normocephalic and  atraumatic.  Eyes:     General: No scleral icterus. Cardiovascular:     Rate and Rhythm: Normal rate.  Pulmonary:     Effort: Pulmonary effort is normal.  Abdominal:     General: Abdomen is flat.     Palpations: Abdomen is soft.  Musculoskeletal:       Back:     Comments: Increased TTP over L4 spinous process.  Skin:    General: Skin is warm and dry.  Neurological:     Mental Status: She is alert and oriented to person, place, and time.  Psychiatric:        Mood and Affect: Mood normal.        Behavior: Behavior normal.       Imaging: IR Radiologist Eval & Mgmt  Result Date: 07/06/2022 EXAM: NEW PATIENT OFFICE VISIT CHIEF COMPLAINT: SEE EPIC NOTE HISTORY OF PRESENT ILLNESS: SEE EPIC NOTE REVIEW OF SYSTEMS: SEE EPIC NOTE PHYSICAL EXAMINATION: SEE EPIC NOTE ASSESSMENT AND PLAN: SEE EPIC NOTE Electronically Signed   By: Jacqulynn Cadet M.D.   On: 07/06/2022 11:26    Labs:  CBC: No results for input(s): "WBC", "HGB", "HCT", "PLT" in the last 8760 hours.  COAGS: No results for input(s): "INR", "APTT" in the last 8760 hours.  BMP: No results for input(s): "NA", "K", "CL", "CO2", "GLUCOSE", "BUN", "CALCIUM", "CREATININE", "GFRNONAA", "GFRAA" in the last 8760 hours.  Invalid input(s): "CMP"  LIVER FUNCTION TESTS: No results for input(s): "BILITOT", "AST", "ALT", "ALKPHOS", "PROT", "ALBUMIN" in the last 8760 hours.  TUMOR MARKERS: No results for input(s): "AFPTM", "CEA", "CA199", "CHROMGRNA" in the last 8760 hours.  Assessment & Plan:   Patient has suffered subacute osteoporotic fracture of the L4 vertebra.   History and exam have demonstrated the following:     ICD-10-CM Codes that Support Medical Necessity ([email or web address])  M80.08XS    Age-related osteoporosis with current pathological fracture, vertebra(e), sequela   Plan:  L4 vertebral body augmentation with balloon kyphoplasty   Post-procedure disposition: outpatient [facility]   The patient has suffered a fracture of the L4 vertebral body. It is recommended that patients aged 72 years or older be evaluated for possible testing or treatment of osteoporosis. A copy of this consult report is sent to the patient's referring physician.   Advanced Care Plan: The patient did not want to provide an [place] at the time of this visit     Total time spent on today's visit was over  15 Minutes, including both face-to-face time and non face-to-face time, personally spent on review of chart (including labs and relevant imaging), discussing further workup and treatment options, referral to specialist if needed, reviewing outside records if pertinent, answering patient questions, and coordinating care regarding osteoporotic L4 compression fracture with pain and disability as well as  management strategy.    Electronically Signed: Antonietta Jewel Cassandria Drew 08/03/2022, 9:10 AM

## 2022-08-12 NOTE — Progress Notes (Signed)
Phone call to pt to follow up from her kyphoplasty on 07/31/22. Pt reports her pain is the same post-procedure and at times worse. Pt denies any signs of infection, redness at the site, draining or fever. Pt has no complaints at this time and will be scheduled for an in person follow up with Dr. Laurence Ferrari on 01/25. Pt advised to call back if anything were to change or any concerns arise and we will arrange an in person appointment. Pt verbalized understanding.

## 2022-08-13 ENCOUNTER — Other Ambulatory Visit: Payer: Self-pay | Admitting: Interventional Radiology

## 2022-08-13 DIAGNOSIS — S32040A Wedge compression fracture of fourth lumbar vertebra, initial encounter for closed fracture: Secondary | ICD-10-CM

## 2022-08-18 ENCOUNTER — Ambulatory Visit
Admission: RE | Admit: 2022-08-18 | Discharge: 2022-08-18 | Disposition: A | Discharge status: Home / Self Care | Payer: Medicare Other | Source: Ambulatory Visit | Attending: Orthopedic Surgery | Admitting: Orthopedic Surgery

## 2022-08-19 ENCOUNTER — Ambulatory Visit
Admission: RE | Admit: 2022-08-19 | Discharge: 2022-08-19 | Disposition: A | Discharge status: Home / Self Care | Payer: Medicare Other | Source: Ambulatory Visit | Attending: Interventional Radiology | Admitting: Interventional Radiology

## 2022-08-19 DIAGNOSIS — M545 Low back pain, unspecified: Secondary | ICD-10-CM

## 2022-08-19 DIAGNOSIS — S32040A Wedge compression fracture of fourth lumbar vertebra, initial encounter for closed fracture: Secondary | ICD-10-CM

## 2022-08-19 HISTORY — PX: IR RADIOLOGIST EVAL & MGMT: IMG5224

## 2022-08-19 NOTE — Progress Notes (Signed)
Chief Complaint: Patient was seen in consultation today for new/worsening low back pain at the request of Ahijah Devery K  Referring Physician(s): Radin Raptis K  History of Present Illness: Yolanda Wade is a 89 y.o. female With a history of subacute osteoporotic compression fracture at L4.  She underwent percutaneous cement augmentation with balloon kyphoplasty on 08/03/2022.  She was feeling significantly improved for the week following the procedure.  However, this past Sunday she began to develop recurrent severe lower back pain.  This pain is more severe than the pain she was experiencing prior to the kyphoplasty.  We obtained a new MRI of the lumbar spine which demonstrates new edema and slight compression deformity of the inferior endplate of L3 consistent with a new compression fracture.  Additionally, there has been further height loss and posterior retropulsion at the prior L4 fracture site compared to the imaging from November resulting in severe spinal stenosis with compression of the cauda equina nerve roots and right worse than left foraminal stenosis at L3-L4.  Her pain is worst in the morning when she gets out of bed.  She is taking Tylenol and ibuprofen as needed for pain.  This helps minimally.  She denies lower extremity weakness, paresthesias or changes in bowel or bladder function.  Past Medical History:  Diagnosis Date   Basal cell carcinoma 09/22/2021   L neck, [facility]   Chicken pox    Hyperlipidemia    Hypertension    Hypothyroidism    Osteopenia    Thyroid cancer ([place]) 06/2017   per patient    Past Surgical History:  Procedure Laterality Date   CARPAL TUNNEL RELEASE Right    CATARACT EXTRACTION Bilateral    DILATION AND CURETTAGE OF UTERUS     for post meopausal bleeding   EYE SURGERY     HALLUX VALGUS [place]     IR KYPHO LUMBAR INC FX REDUCE BONE BX UNI/BIL CANNULATION INC/IMAGING  08/03/2022   IR RADIOLOGIST EVAL & MGMT  07/06/2022   IR  RADIOLOGIST EVAL & MGMT  08/03/2022   IR RADIOLOGIST EVAL & MGMT  08/19/2022   KNEE CARTILAGE SURGERY Right    UMBILICAL HERNIA REPAIR N/A 05/23/2015   Procedure: HERNIA REPAIR UMBILICAL ADULT;  Surgeon: Leonie Green, MD;  Location: [facility] ORS;  Service: General;  Laterality: N/A;    Allergies: Patient has no known allergies.  Medications: Prior to Admission medications   Medication Sig Start Date End Date Taking? Authorizing Provider  atorvastatin (LIPITOR) 20 MG tablet Take 20 mg by mouth daily.    [provider]  Calcium-Vitamin D-Vitamin K (VIACTIV PO) Take 2 Doses by mouth daily. Patient not taking: Reported on 07/06/2022    [provider]  hydrocortisone 2.5 % cream Apply to rash under breasts once to twice daily as directed. Patient not taking: Reported on 07/06/2022 05/19/22   Brendolyn Patty, MD  ketoconazole (NIZORAL) 2 % cream Apply to rash under breasts once to twice daily as directed. Patient not taking: Reported on 07/06/2022 05/19/22   Brendolyn Patty, MD  levothyroxine (SYNTHROID, LEVOTHROID) 75 MCG tablet Take 75 mcg by mouth daily before breakfast.    [provider]  lisinopril-hydrochlorothiazide (PRINZIDE,ZESTORETIC) 10-12.5 MG tablet Take 1 tablet by mouth daily.    [provider]  Multiple Vitamins-Minerals (PRESERVISION AREDS PO) Take 1 tablet by mouth 2 (two) times daily. Patient not taking: Reported on 07/06/2022    [provider]     No family history on file.  Social History   Socioeconomic History   Marital status: Married    Spouse name: Not on file   Number of children: Not on file   Years of education: Not on file   Highest education level: Not on file  Occupational History   Not on file  Tobacco Use   Smoking status: Never   Smokeless tobacco: Never  Substance and Sexual Activity   Alcohol use: Yes    Comment: occasionally   Drug use: No   Sexual activity: Not on file  Other Topics Concern    Not on file  Social History Narrative   Not on file   Social Determinants of Health   Financial Resource Strain: Not on file  Food Insecurity: Not on file  Transportation Needs: Not on file  Physical Activity: Not on file  Stress: Not on file  Social Connections: Not on file   Review of Systems: A 12 point ROS discussed and pertinent positives are indicated in the HPI above.  All other systems are negative.  Review of Systems  Vital Signs: There were no vitals taken for this visit.   Physical Exam Constitutional:      Appearance: Normal appearance.  HENT:     Head: Normocephalic and atraumatic.  Eyes:     General: No scleral icterus. Cardiovascular:     Rate and Rhythm: Normal rate.  Pulmonary:     Effort: Pulmonary effort is normal.  Abdominal:     General: Abdomen is flat. There is no distension.  Musculoskeletal:       Back:     Comments: TTP at L3 spinous process  Skin:    General: Skin is warm and dry.  Neurological:     Mental Status: She is alert and oriented to person, place, and time.  Psychiatric:        Mood and Affect: Mood normal.        Behavior: Behavior normal.       Imaging: IR Radiologist Eval & Mgmt  Result Date: 08/19/2022 EXAM: ESTABLISHED PATIENT OFFICE VISIT CHIEF COMPLAINT: SEE EPIC NOTE HISTORY OF PRESENT ILLNESS: SEE EPIC NOTE REVIEW OF SYSTEMS: SEE EPIC NOTE PHYSICAL EXAMINATION: SEE EPIC NOTE ASSESSMENT AND PLAN: SEE EPIC NOTE Electronically Signed   By: Jacqulynn Cadet M.D.   On: 08/19/2022 16:35   MR LUMBAR SPINE WO CONTRAST  Result Date: 08/18/2022 CLINICAL DATA:  Chronic low back pain worsening since September 2023 after lifting injury. History of kyphoplasty 08/03/2022. EXAM: MRI LUMBAR SPINE WITHOUT CONTRAST TECHNIQUE: Multiplanar, multisequence MR imaging of the lumbar spine was performed. No intravenous contrast was administered. COMPARISON:  Lumbar spine MRI 06/15/2022. FINDINGS: Segmentation: Standard; the lowest formed  disc space is designated L5-S1. Alignment: There is focal kyphosis at T11-T12 related to the T12 compression fracture. There is exaggerated lumbar lordosis with grade 1 anterolisthesis of L3 on L4, L4 on L5, and L5 on S1, all unchanged. Vertebrae: Compression deformities of the T12 and L1 vertebral bodies with mild bony retropulsion at T12 are unchanged with a superimposed prominent Schmorl's node along the superior L1 endplate. There is increased marrow edema along the inferior L3 endplate with associated T1 hypointensity. There is possible slight compression deformity of the inferior endplate (9-7). There is no significant loss of vertebral body height or bony retropulsion. Compression deformity of the L4 vertebral body is again seen with interval vertebral augmentation but now with up to approximately 50% loss of vertebral body height. The compression fracture is overall progressed since  06/15/2022. Mild bony retropulsion is slightly worsened. The other vertebral body heights are preserved. Intraosseous hemangiomas are noted in the T9, L2, and L5 vertebral bodies. Background marrow signal is diffusely heterogeneous but within normal limits. Conus medullaris and cauda equina: Conus extends to the L1-L2 level. Conus and cauda equina appear normal. Paraspinal and other soft tissues: Bilateral renal cysts are noted requiring no specific imaging follow-up. There is perifacetal soft tissue edema bilaterally at L4-L5, slightly worse on the left and overall decreased since the prior study. The paraspinal soft tissues are otherwise unremarkable. The large pelvic mass is again seen, overall not significantly changed in the short interim. Disc levels: T11-T12: Mild spinal canal stenosis due to bony retropulsion of the T12 vertebral body without significant neural foraminal stenosis, unchanged T12-L1: No significant spinal canal or neural foraminal stenosis L1-L2: There is a mild disc bulge and bilateral facet arthropathy  without significant spinal canal or neural foraminal stenosis, not significantly changed. L2-L3: There is a mild disc bulge and mild bilateral facet arthropathy with small right larger than left effusions but no significant spinal canal or neural foraminal stenosis, not significantly changed L3-L4: There is a disc bulge eccentric to the right, degenerative endplate change, and bilateral facet arthropathy with superimposed bony retropulsion of the L4 vertebral body resulting in severe spinal canal stenosis with compression of the cauda equina nerve roots, and moderate to severe right and mild left neural foraminal stenosis. The spinal canal and right neural foraminal stenosis appear worsened in the interim. L4-L5: There is a disc bulge, degenerative endplate change, and moderate facet arthropathy with ligamentum flavum thickening resulting in moderate spinal canal stenosis with bilateral subarticular zone narrowing and mild bilateral neural foraminal stenosis, unchanged L5-S1: There is grade 1 anterolisthesis with uncovering of the disc posteriorly and associated degenerative endplate change and advanced facet arthropathy resulting in moderate to severe spinal canal stenosis with left worse than right subarticular zone effacement, and moderate bilateral neural foraminal stenosis, unchanged. IMPRESSION: 1. Compression deformity of the L4 vertebral body with interval kyphoplasty is progressed since 06/15/2022 with worsened loss of height and slightly worsened bony retropulsion, now resulting in severe spinal canal stenosis with compression of the cauda equina nerve roots, and moderate to severe right and mild left neural foraminal stenosis at L3-L4. 2. Increased marrow edema along the inferior L3 endplate with possible slight compression deformity of the inferior endplate, suspicious for additional acute to subacute compression fracture. No significant loss of vertebral body height or retropulsion. 3. Unchanged  compression deformities of the T12 and L1 vertebral bodies with mild bony retropulsion at T12. 4. Multilevel degenerative changes as above are otherwise stable since 06/15/2022. 5. Large pelvic mass is again seen, overall not significantly changed in the short interim. Electronically Signed   By: Valetta Mole M.D.   On: 08/18/2022 16:02   IR KYPHO LUMBAR INC FX REDUCE BONE BX UNI/BIL CANNULATION INC/IMAGING  Result Date: 08/03/2022 CLINICAL DATA:  89 year old female with an osteoporotic compression fracture of L4. After failing a trial of conservative therapy in developing worsening symptoms, she presents today for cement augmentation with balloon kyphoplasty. M80.08XS Age-related osteoporosis with current pathological fracture, vertebra(e), sequela EXAM: FLUOROSCOPIC GUIDED KYPHOPLASTY OF THE L4 VERTEBRAL BODY COMPARISON:  None Available. MEDICATIONS: As antibiotic prophylaxis, 2 g Ancef was ordered pre-procedure and administered intravenously within 1 hour of incision. ANESTHESIA/SEDATION: Moderate (conscious) sedation was employed during this procedure. A total of Versed 1.5 mg and Fentanyl 50 mcg was administered intravenously. Moderate Sedation Time:  33 minutes. The patient's level of consciousness and vital signs were monitored continuously by radiology nursing throughout the procedure under my direct supervision. FLUOROSCOPY TIME:  Min,  seconds ( mGy) COMPLICATIONS: None immediate. PROCEDURE: The procedure, risks (including but not limited to bleeding, infection, organ damage), benefits, and alternatives were explained to the patient. Questions regarding the procedure were encouraged and answered. The patient understands and consents to the procedure. The patient was placed prone on the fluoroscopic table. The skin overlying the upper thoracic region was then prepped and draped in the usual sterile fashion. Maximal barrier sterile technique was utilized including caps, mask, sterile gowns, sterile gloves,  sterile drape, hand hygiene and skin antiseptic. Intravenous Fentanyl and Versed were administered as conscious sedation during continuous cardiorespiratory monitoring by the radiology RN. The right pedicle at L4 was then infiltrated with 1% lidocaine followed by the advancement of a Kyphon trocar needle through the left pedicle into the posterior one-third of the vertebral body. Subsequently, the osteo drill was advanced to the anterior third of the vertebral body. The osteo drill was retracted. Through the working cannula, a Kyphon inflatable bone tamp 15 x 2.5 was advanced and positioned with the distal marker approximately 5 mm from the anterior aspect of the cortex. Appropriate positioning was confirmed on the AP projection. At this time, the balloon was expanded using contrast via a Kyphon inflation syringe device via micro tubing. Inflations were continued until there was near apposition with the superior end plate. At this time, methylmethacrylate mixture was reconstituted in the Kyphon bone mixing device system. This was then loaded into the delivery mechanism, attached to Kyphon bone fillers. The balloons were deflated and removed followed by the instillation of methylmethacrylate mixture with excellent filling in the AP and lateral projections. There is moderate extravasation of cement through a defect in the superior endplate and into the G4-W1 disc space. The working cannulae and the bone filler were then retrieved and removed. Hemostasis was achieved with manual compression. The patient tolerated the procedure well without immediate postprocedural complication. IMPRESSION: 1. Technically successful L4 vertebral body augmentation using balloon kyphoplasty. 2. Per CMS PQRS reporting requirements (PQRS Measure 24): Given the patient's age of greater than 61 and the fracture site (hip, distal radius, or spine), the patient should be tested for osteoporosis using DXA, and the appropriate treatment  considered based on the DXA results. Electronically Signed   By: Jacqulynn Cadet M.D.   On: 08/03/2022 10:18   IR Radiologist Eval & Mgmt  Result Date: 08/03/2022 EXAM: ESTABLISHED PATIENT OFFICE VISIT CHIEF COMPLAINT: SEE EPIC NOTE HISTORY OF PRESENT ILLNESS: SEE EPIC NOTE REVIEW OF SYSTEMS: SEE EPIC NOTE PHYSICAL EXAMINATION: SEE EPIC NOTE ASSESSMENT AND PLAN: SEE EPIC NOTE Electronically Signed   By: Jacqulynn Cadet M.D.   On: 08/03/2022 09:08    Labs:  CBC: No results for input(s): "WBC", "HGB", "HCT", "PLT" in the last 8760 hours.  COAGS: No results for input(s): "INR", "APTT" in the last 8760 hours.  BMP: No results for input(s): "NA", "K", "CL", "CO2", "GLUCOSE", "BUN", "CALCIUM", "CREATININE", "GFRNONAA", "GFRAA" in the last 8760 hours.  Invalid input(s): "CMP"  LIVER FUNCTION TESTS: No results for input(s): "BILITOT", "AST", "ALT", "ALKPHOS", "PROT", "ALBUMIN" in the last 8760 hours.  TUMOR MARKERS: No results for input(s): "AFPTM", "CEA", "CA199", "CHROMGRNA" in the last 8760 hours.  Assessment and Plan:  Very pleasant but unfortunate 89 year old female who was doing better for 1 week following L4 cement augmentation with balloon kyphoplasty.  Unfortunately,  she has developed recurrent severe lower back pain.  New MR imaging demonstrates a developing compression fracture of the inferior endplate of L3 as well as progressive spinal and bilateral foraminal stenosis at L3-L4.  Her new symptoms are likely a combination of osteoporotic compression fracture pain and spinal stenosis.  We discussed treatment options including L3 cement augmentation with balloon kyphoplasty as well as L3-L4 epidural steroid injection.  At this time, she would like to proceed conservatively.  She is going to try rest, Tylenol, ibuprofen and alternating heating pad and ice pack for 1 week.  If her symptoms persist or worsen, then she we will consider moving forward with  treatment.    Electronically Signed: Criselda Peaches 08/19/2022, 4:40 PM   I spent a total of 15 Minutes in face to face in clinical consultation, greater than 50% of which was counseling/coordinating care for low back pain

## 2022-11-23 ENCOUNTER — Ambulatory Visit: Payer: Medicare Other | Admitting: Dermatology

## 2023-05-30 ENCOUNTER — Ambulatory Visit: Payer: Medicare Other | Admitting: Dermatology

## 2023-05-30 DIAGNOSIS — W908XXA Exposure to other nonionizing radiation, initial encounter: Secondary | ICD-10-CM | POA: Diagnosis not present

## 2023-05-30 DIAGNOSIS — L720 Epidermal cyst: Secondary | ICD-10-CM | POA: Diagnosis not present

## 2023-05-30 DIAGNOSIS — L304 Erythema intertrigo: Secondary | ICD-10-CM

## 2023-05-30 DIAGNOSIS — L821 Other seborrheic keratosis: Secondary | ICD-10-CM

## 2023-05-30 DIAGNOSIS — L578 Other skin changes due to chronic exposure to nonionizing radiation: Secondary | ICD-10-CM | POA: Diagnosis not present

## 2023-05-30 DIAGNOSIS — Z85828 Personal history of other malignant neoplasm of skin: Secondary | ICD-10-CM

## 2023-05-30 NOTE — Patient Instructions (Addendum)
Mix equal amounts of hydrocortisone 2.5% cream with ketaconazole 2% cream and apply to affected areas twice a day.  If improved, decrease to hydrocortisone and ketaconazole mixed once a day. If still clear, decrease to ketaconazole cream only, once daily to help prevent flares.  Do not use the hydrocortisone cream for maintenance, since long term use of a topical steroid can thin the skin.  May repeat regimen as needed for flares.     Melanoma ABCDEs  Melanoma is the most dangerous type of skin cancer, and is the leading cause of death from skin disease.  You are more likely to develop melanoma if you: Have light-colored skin, light-colored eyes, or red or blond hair Spend a lot of time in the sun Tan regularly, either outdoors or in a tanning bed Have had blistering sunburns, especially during childhood Have a close family member who has had a melanoma Have atypical moles or large birthmarks  Early detection of melanoma is key since treatment is typically straightforward and cure rates are extremely high if we catch it early.   The first sign of melanoma is often a change in a mole or a new dark spot.  The ABCDE system is a way of remembering the signs of melanoma.  A for asymmetry:  The two halves do not match. B for border:  The edges of the growth are irregular. C for color:  A mixture of colors are present instead of an even brown color. D for diameter:  Melanomas are usually (but not always) greater than 6mm - the size of a pencil eraser. E for evolution:  The spot keeps changing in size, shape, and color.  Please check your skin once per month between visits. You can use a small mirror in front and a large mirror behind you to keep an eye on the back side or your body.   If you see any new or changing lesions before your next follow-up, please call to schedule a visit.  Please continue daily skin protection including broad spectrum sunscreen SPF 30+ to sun-exposed areas,  reapplying every 2 hours as needed when you're outdoors.   Staying in the shade or wearing long sleeves, sun glasses (UVA+UVB protection) and wide brim hats (4-inch brim around the entire circumference of the hat) are also recommended for sun protection.      Due to recent changes in healthcare laws, you may see results of your pathology and/or laboratory studies on MyChart before the doctors have had a chance to review them. We understand that in some cases there may be results that are confusing or concerning to you. Please understand that not all results are received at the same time and often the doctors may need to interpret multiple results in order to provide you with the best plan of care or course of treatment. Therefore, we ask that you please give [place] 2 business days to thoroughly review all your results before contacting the office for clarification. Should we see a critical lab result, you will be contacted sooner.   If You Need Anything After Your Visit  If you have any questions or concerns for your doctor, please call our main line at [phone] and press option 4 to reach your doctor's medical assistant. If no one answers, please leave a voicemail as directed and we will return your call as soon as possible. Messages left after 4 pm will be answered the following business day.   You may also send [place] a message  via MyChart. We typically respond to MyChart messages within 1-2 business days.  For prescription refills, please ask your pharmacy to contact our office. Our fax number is [phone].  If you have an urgent issue when the clinic is closed that cannot wait until the next business day, you can page your doctor at the number below.    Please note that while we do our best to be available for urgent issues outside of office hours, we are not available 24/7.   If you have an urgent issue and are unable to reach [place], you may choose to seek medical care at your doctor's office,  retail clinic, urgent care center, or emergency room.  If you have a medical emergency, please immediately call 911 or go to the emergency department.  Pager Numbers  - Dr. Gwen Pounds: [phone]  - Dr. Roseanne Reno: [phone]  - Dr. Katrinka Blazing: [phone]   In the event of inclement weather, please call our main line at [phone] for an update on the status of any delays or closures.  Dermatology Medication Tips: Please keep the boxes that topical medications come in in order to help keep track of the instructions about where and how to use these. Pharmacies typically print the medication instructions only on the boxes and not directly on the medication tubes.   If your medication is too expensive, please contact our office at [phone] option 4 or send [place] a message through MyChart.   We are unable to tell what your co-pay for medications will be in advance as this is different depending on your insurance coverage. However, we may be able to find a substitute medication at lower cost or fill out paperwork to get insurance to cover a needed medication.   If a prior authorization is required to get your medication covered by your insurance company, please allow [place] 1-2 business days to complete this process.  Drug prices often vary depending on where the prescription is filled and some pharmacies may offer cheaper prices.  The website www.goodrx.com contains coupons for medications through different pharmacies. The prices here do not account for what the cost may be with help from insurance (it may be cheaper with your insurance), but the website can give you the price if you did not use any insurance.  - You can print the associated coupon and take it with your prescription to the pharmacy.  - You may also stop by our office during regular business hours and pick up a GoodRx coupon card.  - If you need your prescription sent electronically to a different pharmacy, notify our office  through [facility] or by phone at [phone] option 4.     Si Usted Necesita Algo Despus de Su Visita  Tambin puede enviarnos un mensaje a travs de Clinical cytogeneticist. Por lo general respondemos a los mensajes de MyChart en el transcurso de 1 a 2 das hbiles.  Para renovar recetas, por favor pida a su farmacia que se ponga en contacto con nuestra oficina. Annie Sable de fax es [place] [phone].  Si tiene un asunto urgente cuando la clnica est cerrada y que no puede esperar hasta el siguiente da hbil, puede llamar/localizar a su doctor(a) al nmero que aparece a continuacin.   Por favor, tenga en cuenta que aunque hacemos todo lo posible para estar disponibles para asuntos urgentes fuera del horario de [place], no estamos disponibles las 24 horas del da, los 7 [address]  [address] de la [place].   Si tiene un problema urgente  y no puede comunicarse con nosotros, puede optar por buscar atencin mdica  en el consultorio de su doctor(a), en una clnica privada, en un centro de atencin urgente o en una sala de emergencias.  Si tiene Engineer, drilling, por favor llame inmediatamente al 911 o vaya a la sala de emergencias.  Nmeros de bper  - Dr. Gwen Pounds: [phone]  - Dra. Roseanne Reno: [phone]  - Dr. Katrinka Blazing: [phone]   En caso de inclemencias del tiempo, por favor llame a Lacy Duverney principal al [phone] para una actualizacin sobre el [place] de cualquier retraso o cierre.  Consejos para la medicacin en dermatologa: Por favor, guarde las cajas en las que vienen los medicamentos de uso tpico para ayudarle a seguir las instrucciones sobre dnde y cmo usarlos. Las farmacias generalmente imprimen las instrucciones del medicamento slo en las cajas y no directamente en los tubos del [place].   Si su medicamento es muy caro, por favor, pngase en contacto con Rolm Gala llamando al [phone] y presione la opcin 4 o envenos un mensaje a travs de Clinical cytogeneticist.   No  podemos decirle cul ser su copago por los medicamentos por adelantado ya que esto es diferente dependiendo de la cobertura de su seguro. Sin embargo, es posible que podamos encontrar un medicamento sustituto a Audiological scientist un formulario para que el seguro cubra el medicamento que se considera necesario.   Si se requiere una autorizacin previa para que su compaa de seguros [place] su medicamento, por favor permtanos de 1 a 2 das hbiles para completar [address]  Los precios de los medicamentos varan con frecuencia dependiendo del Environmental consultant de dnde se surte la receta y alguna farmacias pueden ofrecer precios ms baratos.  El sitio web www.goodrx.com tiene cupones para medicamentos de Health and safety inspector. Los precios aqu no tienen en cuenta lo que podra costar con la ayuda del seguro (puede ser ms barato con su seguro), pero el sitio web puede darle el precio si no utiliz Tourist information centre manager.  - Puede imprimir el cupn correspondiente y llevarlo con su receta a la farmacia.  - Tambin puede pasar por nuestra oficina durante el horario de atencin regular y Education officer, museum una tarjeta de cupones de GoodRx.  - Si necesita que su receta se enve electrnicamente a una farmacia diferente, informe a nuestra oficina a travs de MyChart de [place] o por telfono llamando al [phone] y presione la opcin 4.

## 2023-05-30 NOTE — Progress Notes (Signed)
Follow-Up Visit   Subjective  Yolanda Wade is a 89 y.o. female who presents for the following:  patient here for follow up history of bcc, hx of intertrigo under breast.   The patient has spots, moles and lesions to be evaluated, some may be new or changing and the patient may have concern these could be cancer.   The following portions of the chart were reviewed this encounter and updated as appropriate: medications, allergies, medical history  Review of Systems:  No other skin or systemic complaints except as noted in HPI or Assessment and Plan.  Objective  Well appearing patient in no apparent distress; mood and affect are within normal limits.  A focused examination was performed of the following areas: Face, inframammary b/l , neck  Relevant exam findings are noted in the Assessment and Plan.    Assessment & Plan    Erythema intertrigo inframammary  Exam:  Mild erythema/maceration at inframammary    Chronic and persistent condition with duration or expected duration over one year. Condition is bothersome/symptomatic for patient. Currently flared.  Pt states she isn't using her Rx topicals regularly.   Intertrigo is a chronic recurrent rash that occurs in skin fold areas that may be associated with friction; heat; moisture; yeast; fungus; and bacteria.  It is exacerbated by increased movement / activity; sweating; and higher atmospheric temperature.   Continue hydrocortisone 2.5% cream Apply qd/bid as directed dsp 30g 2Rf Continue ketoconazole 2% cream Apply qd/bid as directed dsp 60g 2Rf Continue Zeasorb AF powder qam for maintenance.   Mix equal amounts of hydrocortisone 2.5% cream with ketaconazole 2% cream and apply to affected areas twice a day.  If improved, decrease to hydrocortisone and ketaconazole mixed once a day. If still clear, decrease to ketaconazole cream only, once daily to help prevent flares.  Do not use the hydrocortisone cream for maintenance,  since long term use of a topical steroid can thin the skin.  May repeat regimen as needed for flares.   SEBORRHEIC KERATOSIS - Stuck-on, waxy, tan-brown papules and/or plaques  - Benign-appearing - Discussed benign etiology and prognosis. - Observe - Call for any changes  MILIA Exam: erythematous firm white papule At right forehead x 2  Discussed this is a type of cyst Benign-appearing. Discussed extraction if symptomatic.  Treatment Plan: Symptomatic, irritating, patient would like treated.   Procedure risks and benefits were discussed with the patient including bruising and verbal consent was obtained. Following prep of the skin of the right forehead with an alcohol swab, area was injected with 1% lidocaine/epinephrine, extraction of milia was performed with cotton tip applicators following superficial incision made over their surfaces with a #11 surgical blade. Capillary hemostasis was achieved with 20% aluminum chloride solution. Vaseline ointment was applied to each site. The patient tolerated the procedure well.   ACTINIC DAMAGE - chronic, secondary to cumulative UV radiation exposure/sun exposure over time - diffuse scaly erythematous macules with underlying dyspigmentation - Recommend daily broad spectrum sunscreen SPF 30+ to sun-exposed areas, reapply every 2 hours as needed.  - Recommend staying in the shade or wearing long sleeves, sun glasses (UVA+UVB protection) and wide brim hats (4-inch brim around the entire circumference of the hat). - Call for new or changing lesions.  HISTORY OF BASAL CELL CARCINOMA OF THE SKIN Left neck, ED&C 09/22/2021 - No evidence of recurrence today - Recommend regular full body skin exams - Recommend daily broad spectrum sunscreen SPF 30+ to sun-exposed areas, reapply every 2  hours as needed.  - Call if any new or changing lesions are noted between office visits   Return in about 6 months (around 11/27/2023) for TBSE.  I, Asher Muir,  CMA, am acting as scribe for Willeen Niece, MD.   Documentation: I have reviewed the above documentation for accuracy and completeness, and I agree with the above.  Willeen Niece, MD

## 2023-10-09 ENCOUNTER — Other Ambulatory Visit: Payer: Self-pay

## 2023-10-09 ENCOUNTER — Encounter: Payer: Self-pay | Admitting: Emergency Medicine

## 2023-10-09 ENCOUNTER — Ambulatory Visit
Admission: EM | Admit: 2023-10-09 | Discharge: 2023-10-09 | Disposition: A | Discharge status: Home / Self Care | Attending: Emergency Medicine | Admitting: Emergency Medicine

## 2023-10-09 DIAGNOSIS — S0990XA Unspecified injury of head, initial encounter: Secondary | ICD-10-CM

## 2023-10-09 DIAGNOSIS — S0101XA Laceration without foreign body of scalp, initial encounter: Secondary | ICD-10-CM | POA: Diagnosis not present

## 2023-10-09 MED ORDER — LIDOCAINE-EPINEPHRINE-TETRACAINE (LET) TOPICAL GEL
3.0000 mL | Freq: Once | TOPICAL | Status: AC
  Administered 2023-10-09: 3 mL via TOPICAL

## 2023-10-09 NOTE — Discharge Instructions (Signed)
 Today you were evaluated for the laceration and head injury  Due to your age it is highly recommended that you get a CT scan to rule out complications from hitting your head against the pavement  At any point if you begin to experience dizziness, lightheadedness, a severe headache, you pass out, have blurry vision or start to see spots or start vomiting you will need to go to to the nearest emergency department  At any point if you become very drowsy or sleepy you need to go to the nearest emergency department  Laceration has been closed with sutures, return in 7 to 10 days for removal  Cleanse area at least once a day with soap and water, gently scrub over the area  Someone check the wound at least once daily to monitor for signs of infection, at any point if you begin to experience redness, swelling or drainage from your wound please follow-up as these are signs of infection

## 2023-10-09 NOTE — ED Provider Notes (Addendum)
 Yolanda Wade    CSN: [id number] Arrival date & time: 10/09/23  1111      History   Chief Complaint No chief complaint on file.   HPI Yolanda Wade is a 89 y.o. female.   Patient presents for evaluation of a laceration to the posterior scalp that occurred within the hour after fall.  Lost balance and tripped causing head to hit the pavement.  Was evaluated by EMS who recommended in person evaluation, applied bandage.  Denies headache, dizziness, lightheadedness, visual disturbance, vomiting or drowsiness.  Denies use of blood thinners.  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   KNEE PAIN, RIGHT, ACUTE 04/11/2008   Hypothyroidism 04/04/2008   HYPERLIPIDEMIA 04/04/2008   Essential hypertension 04/04/2008   KNEE SPRAIN 04/04/2008    Past Surgical History:  Procedure Laterality Date   CARPAL TUNNEL RELEASE Right    CATARACT EXTRACTION Bilateral    DILATION AND CURETTAGE OF UTERUS     for post meopausal bleeding   EYE SURGERY     HALLUX VALGUS AUSTIN     IR KYPHO LUMBAR INC FX REDUCE BONE BX UNI/BIL CANNULATION INC/IMAGING  08/03/2022   IR RADIOLOGIST EVAL & MGMT  07/06/2022   IR RADIOLOGIST EVAL & MGMT  08/03/2022   IR RADIOLOGIST EVAL & MGMT  08/19/2022   KNEE CARTILAGE SURGERY Right    UMBILICAL HERNIA REPAIR N/A 05/23/2015   Procedure: HERNIA REPAIR UMBILICAL ADULT;  Surgeon: Nadeen Landau, MD;  Location: [facility] ORS;  Service: General;  Laterality: N/A;    OB History   No obstetric history on  file.      Home Medications    Prior to Admission medications   Medication Sig Start Date End Date Taking? Authorizing Provider  atorvastatin (LIPITOR) 20 MG tablet Take 20 mg by mouth daily.    [provider]  Calcium-Vitamin D-Vitamin K (VIACTIV PO) Take 2 Doses by mouth daily. Patient not taking: Reported on 07/06/2022    [provider]  hydrocortisone 2.5 % cream Apply to rash under breasts once to twice daily as directed. Patient not taking: Reported on 07/06/2022 05/19/22   Willeen Niece, MD  ketoconazole (NIZORAL) 2 % cream Apply to rash under breasts once to twice daily as directed. Patient not taking: Reported on 07/06/2022 05/19/22   Willeen Niece, MD  levothyroxine (SYNTHROID, LEVOTHROID) 75 MCG tablet Take 75 mcg by mouth daily before breakfast.    [provider]  lisinopril-hydrochlorothiazide (PRINZIDE,ZESTORETIC) 10-12.5 MG tablet Take 1 tablet by mouth daily.    [provider]  Multiple Vitamins-Minerals (PRESERVISION AREDS PO) Take 1 tablet by mouth 2 (two) times daily. Patient not taking: Reported on 07/06/2022    [provider]    Family History No family history on file.  Social History Social History   Tobacco Use   Smoking status: Never   Smokeless tobacco: Never  Substance Use Topics   Alcohol use: Yes    Comment: occasionally  Drug use: No     Allergies   Patient has no known allergies.   Review of Systems Review of Systems   Physical Exam Triage Vital Signs ED Triage Vitals  Encounter Vitals Group     BP      Systolic BP Percentile      Diastolic BP Percentile      Pulse      Resp      Temp      Temp src      SpO2      Weight      Height      Head Circumference      Peak Flow      Pain Score      Pain Loc      Pain Education      Exclude from Growth Chart    No data found.  Updated Vital Signs There were no vitals taken for this visit.  Visual Acuity Right Eye  Distance:   Left Eye Distance:   Bilateral Distance:    Right Eye Near:   Left Eye Near:    Bilateral Near:     Physical Exam Constitutional:      Appearance: Normal appearance.  Eyes:     Extraocular Movements: Extraocular movements intact.  Pulmonary:     Effort: Pulmonary effort is normal.  Skin:    Comments: 1 cm Laceration present to the left occipital aspect of the scalp  Neurological:     General: No focal deficit present.     Mental Status: She is alert and oriented to person, place, and time. Mental status is at baseline.      UC Treatments / Results  Labs (all labs ordered are listed, but only abnormal results are displayed) Labs Reviewed - No data to display  EKG   Radiology No results found.  Procedures Laceration Repair  Date/Time: 10/09/2023 11:46 AM  Performed by: Valinda Hoar, NP Authorized by: Valinda Hoar, NP   Consent:    Consent obtained:  Verbal   Consent given by:  Patient   Risks, benefits, and alternatives were discussed: yes     Risks discussed:  Pain and infection Universal protocol:    Procedure explained and questions answered to patient or proxy's satisfaction: yes     Patient identity confirmed:  Verbally with patient Anesthesia:    Anesthesia method:  Topical application   Topical anesthetic:  LET Laceration details:    Location:  Scalp   Scalp location:  Occipital   Length (cm):  1   Depth (mm):  0.5 Exploration:    Wound exploration: entire depth of wound visualized   Treatment:    Area cleansed with:  Chlorhexidine   Irrigation method:  Tap Skin repair:    Repair method:  Staples   Number of staples:  3 Approximation:    Approximation:  Close Repair type:    Repair type:  Simple Post-procedure details:    Dressing:  Open (no dressing)   Procedure completion:  Tolerated  (including critical care time)  Medications Ordered in UC Medications - No data to display  Initial Impression / Assessment and  Plan / UC Course  I have reviewed the triage vital signs and the nursing notes.  Pertinent labs & imaging results that were available during my care of the patient were reviewed by me and considered in my medical decision making (see chart for details).  Laceration of scalp, injury of head  Vitals are  stable, no neurological deficits, advise ED evaluation for CT scan due to age, declined, wound cleansed and sutures applied, advise removal in 7 to 10 days, advised to monitor for signs of infection, advised daily cleansing, given ER precautions Final Clinical Impressions(s) / UC Diagnoses   Final diagnoses:  Laceration of scalp, initial encounter  Injury of head, initial encounter     Discharge Instructions      Today you were evaluated for the laceration and head injury  Due to your age it is highly recommended that you get a CT scan to rule out complications from hitting your head against the pavement  At any point if you begin to experience dizziness, lightheadedness, a severe headache, you pass out, have blurry vision or start to see spots or start vomiting you will need to go to to the nearest emergency department  At any point if you become very drowsy or sleepy you need to go to the nearest emergency department  Laceration has been closed with sutures, return in 7 to 10 days for removal  Cleanse area at least once a day with soap and water, gently scrub over the area  Someone check the wound at least once daily to monitor for signs of infection, at any point if you begin to experience redness, swelling or drainage from your wound please follow-up as these are signs of infection   ED Prescriptions   None    PDMP not reviewed this encounter.   Valinda Hoar, NP 10/09/23 1119    Valinda Hoar, NP 10/09/23 1146

## 2023-10-09 NOTE — ED Triage Notes (Signed)
 Patient presents to [facility] for evaluation of fall backwards, she felt like her feet gave out.  She has a laceration to the back lower part of her head and would like it closed

## 2023-10-17 ENCOUNTER — Ambulatory Visit
Admission: RE | Admit: 2023-10-17 | Discharge: 2023-10-17 | Disposition: A | Discharge status: Home / Self Care | Source: Ambulatory Visit | Attending: Family Medicine | Admitting: Family Medicine

## 2023-10-17 ENCOUNTER — Other Ambulatory Visit: Payer: Self-pay

## 2023-10-17 DIAGNOSIS — Z4802 Encounter for removal of sutures: Secondary | ICD-10-CM

## 2023-10-17 NOTE — Discharge Instructions (Addendum)
 Follow up as needed.  May participate in pool or ocean activities as usual.  Remind haoir dresser to be gentle when washing, drying, combing, styliung the are with wound

## 2023-10-17 NOTE — ED Notes (Signed)
 On this nurse arrival, staff had attempted staple removal.  Patient present in treatment room prior to this nurse arrival

## 2023-10-17 NOTE — ED Triage Notes (Signed)
 Patient seen 3/16 for sutures to back of head.  Patient has 3 staples.  Site unremarkable

## 2023-10-17 NOTE — ED Notes (Signed)
 Discussed care of wound at hair dressers this week Question reported she is going on a trip and sked about any instructions for pool or ocean participatient.  Verified with provider adrienne white, np, no precautions for pool or ocean use

## 2023-12-06 ENCOUNTER — Ambulatory Visit: Payer: Medicare Other | Admitting: Dermatology

## 2023-12-06 DIAGNOSIS — E66811 Obesity, class 1: Secondary | ICD-10-CM | POA: Insufficient documentation

## 2023-12-07 ENCOUNTER — Ambulatory Visit: Admitting: Dermatology

## 2023-12-30 ENCOUNTER — Encounter: Payer: Self-pay | Admitting: Student

## 2023-12-30 ENCOUNTER — Ambulatory Visit: Admitting: Student

## 2023-12-30 VITALS — BP 138/82 | HR 86 | Temp 97.1°F | Ht <= 58 in | Wt 164.2 lb

## 2023-12-30 DIAGNOSIS — M25561 Pain in right knee: Secondary | ICD-10-CM | POA: Diagnosis not present

## 2023-12-30 DIAGNOSIS — I10 Essential (primary) hypertension: Secondary | ICD-10-CM

## 2023-12-30 DIAGNOSIS — N1831 Chronic kidney disease, stage 3a: Secondary | ICD-10-CM

## 2023-12-30 DIAGNOSIS — M8588 Other specified disorders of bone density and structure, other site: Secondary | ICD-10-CM

## 2023-12-30 DIAGNOSIS — E039 Hypothyroidism, unspecified: Secondary | ICD-10-CM

## 2023-12-30 DIAGNOSIS — Z66 Do not resuscitate: Secondary | ICD-10-CM

## 2023-12-30 DIAGNOSIS — E782 Mixed hyperlipidemia: Secondary | ICD-10-CM

## 2023-12-30 NOTE — Patient Instructions (Signed)
 VISIT SUMMARY:  Today, we discussed your knee pain, chronic kidney disease, anemia, osteopenia, hypothyroidism, and macular degeneration. We reviewed your symptoms, current treatments, and made plans for further management.  YOUR PLAN:  -KNEE PAIN: Knee pain can be caused by various factors including strain, arthritis, or injury. We will order an x-ray to check for any structural issues and refer you to physical therapy for strengthening exercises.  -CHRONIC KIDNEY DISEASE STAGE 3A: Chronic kidney disease stage 3A means your kidneys are not working as well as they should, but the condition is stable. No new treatment is needed at this time.  -ANEMIA: Anemia is a condition where you have fewer red blood cells than normal, which can cause fatigue. We will monitor your hemoglobin levels in future lab work.  -OSTEOPENIA: Osteopenia is a condition where your bones are weaker than normal but not so far gone as to be classified as osteoporosis. We may consider testing your vitamin D levels in future lab work.  -HYPOTHYROIDISM POST-THYROIDECTOMY: Hypothyroidism means your thyroid  gland does not produce enough hormones. This is managed with levothyroxine, and your current dosage is appropriate.  -MACULAR DEGENERATION: Macular degeneration is an eye condition that can cause vision loss. Your condition is stable, and you should continue regular follow-ups with your ophthalmologist.  INSTRUCTIONS:  Please schedule an x-ray for your knee and follow up with physical therapy for strengthening exercises. Continue taking your current medications as prescribed. We will monitor your hemoglobin levels and consider vitamin D testing in future lab work. Keep your regular follow-up appointments with your ophthalmologist.

## 2023-12-31 DIAGNOSIS — N1831 Chronic kidney disease, stage 3a: Secondary | ICD-10-CM | POA: Insufficient documentation

## 2023-12-31 NOTE — Progress Notes (Signed)
 [facility] Provider: Jann Melody  Code Status: DNR Goals of Care:     12/30/2023    9:23 AM  Advanced Directives  Does Patient Have a Medical Advance Directive? Yes  Type of Estate agent of [place];Out of facility DNR (pink MOST or yellow form);Living will  Does patient want to make changes to medical advance directive? No - Patient declined  Copy of Healthcare Power of Attorney in Chart? No - copy requested     Chief Complaint  Patient presents with   Establish Care    Establish Care.     HPI: Patient is a 89 y.o. female seen today for medical management of chronic diseases.   Discussed the use of AI scribe software for clinical note transcription with the patient, who gave verbal consent to proceed.  History of Present Illness   Yolanda Wade is a 89 year old female who presents with knee pain.  She has been experiencing knee pain for the past couple of weeks, which worsens with activities such as bending down to feed her cat. There is no history of trauma or specific incident that triggered the pain. She has not taken any medication specifically for the knee pain but uses Tylenol  for her back pain. The knee pain is intermittent and was not present at the time of the visit.  She has a history of back surgery involving the fourth vertebra, followed by a fracture in the third vertebra. She received injections for pain management, which were painful, and subsequently pursued physical therapy, which she found beneficial. She is not currently in physical therapy due to insurance limitations but is interested in returning for periodic check-ins.  Her past medical history includes a thyroidectomy due to a nodule pressing on her trachea, which was found to have capillary cells upon removal. She also has a history of hernia repair, high cholesterol, elevated blood pressure, macular degeneration, and osteopenia. She manages her cholesterol  with atorvastatin and takes levothyroxine for her thyroid .  She was told in the past that she was anemic and takes B12. She has a strong support network from her church, daughter, and a young friend named Mariah Shines. She has a history of working at [employer] and has traveled extensively, including recent trips to the [place] and plans for a future trip to [place]. She uses a walker at home for stability, especially at night.  She reports issues with bowel movements, describing them as soft but difficult to pass, requiring significant effort. She notes that when the urge to defecate arises, it is urgent.         Past Medical History:  Diagnosis Date   Basal cell carcinoma 09/22/2021   L neck, [facility]   Chicken pox    Hyperlipidemia    Hypertension    Hypothyroidism    Osteopenia    Thyroid  cancer (HCC) 06/2017   per patient    Past Surgical History:  Procedure Laterality Date   CARPAL TUNNEL RELEASE Right    CATARACT EXTRACTION Bilateral    DILATION AND CURETTAGE OF UTERUS     for post meopausal bleeding   EYE SURGERY     HALLUX VALGUS AUSTIN     IR KYPHO LUMBAR INC FX REDUCE BONE BX UNI/BIL CANNULATION INC/IMAGING  08/03/2022   IR RADIOLOGIST EVAL & MGMT  07/06/2022   IR RADIOLOGIST EVAL & MGMT  08/03/2022   IR RADIOLOGIST EVAL & MGMT  08/19/2022   KNEE  CARTILAGE SURGERY Right    UMBILICAL HERNIA REPAIR N/A 05/23/2015   Procedure: HERNIA REPAIR UMBILICAL ADULT;  Surgeon: Benancio Bracket, MD;  Location: [facility];  Service: General;  Laterality: N/A;    No Known Allergies  Outpatient Encounter Medications as of 12/30/2023  Medication Sig   atorvastatin (LIPITOR) 20 MG tablet Take 20 mg by mouth daily.   levothyroxine (SYNTHROID) 125 MCG tablet Take 125 mcg by mouth daily before breakfast.   lisinopril-hydrochlorothiazide (PRINZIDE,ZESTORETIC) 10-12.5 MG tablet Take 1 tablet by mouth daily.   Multiple Vitamins-Minerals (PRESERVISION AREDS PO) Take 1 tablet by mouth 2 (two) times daily.    [DISCONTINUED] Calcium-Vitamin D-Vitamin K (VIACTIV PO) Take 2 Doses by mouth daily. (Patient not taking: Reported on 07/06/2022)   [DISCONTINUED] hydrocortisone  2.5 % cream Apply to rash under breasts once to twice daily as directed. (Patient not taking: Reported on 12/30/2023)   [DISCONTINUED] ketoconazole  (NIZORAL ) 2 % cream Apply to rash under breasts once to twice daily as directed. (Patient not taking: Reported on 12/30/2023)   [DISCONTINUED] levothyroxine (SYNTHROID, LEVOTHROID) 75 MCG tablet Take 75 mcg by mouth daily before breakfast.   No facility-administered encounter medications on file as of 12/30/2023.    Review of Systems:  Review of Systems  Health Maintenance  Topic Date Due   Zoster Vaccines- Shingrix (1 of 2) 05/06/1945   DEXA SCAN  Never done   Pneumonia Vaccine 31+ Years old (2 of 2 - PCV) 03/26/2006   Medicare Annual Wellness (AWV)  03/22/2017   COVID-19 Vaccine (6 - 2024-25 season) 12/30/2023   INFLUENZA VACCINE  02/24/2024   DTaP/Tdap/Td (3 - Td or Tdap) 09/21/2031   HPV VACCINES  Aged Out   Meningococcal B Vaccine  Aged Out    Physical Exam: Vitals:   12/30/23 0918  BP: 138/82  Pulse: 86  Temp: (!) 97.1 F (36.2 C)  SpO2: 97%  Weight: 164 lb 3.2 oz (74.5 kg)  Height: 4\' 10"  (1.473 m)   Body mass index is 34.32 kg/m. Physical Exam Constitutional:      Appearance: Normal appearance.  Cardiovascular:     Rate and Rhythm: Normal rate and regular rhythm.     Pulses: Normal pulses.     Heart sounds: Normal heart sounds.  Pulmonary:     Effort: Pulmonary effort is normal.  Abdominal:     General: Abdomen is flat. Bowel sounds are normal.     Palpations: Abdomen is soft.  Skin:    General: Skin is warm and dry.  Neurological:     Mental Status: She is alert and oriented to person, place, and time.     Gait: Gait normal.  Psychiatric:        Mood and Affect: Mood normal.   Physical Exam   HEENT: Left ear hearing loss. EXTREMITIES: No tenderness or  effusion in left knee. Right knee with scab, no pain on manipulation. Cyst on left hand, non-tender.      Labs reviewed: Basic Metabolic Panel: No results for input(s): "NA", "K", "CL", "CO2", "GLUCOSE", "BUN", "CREATININE", "CALCIUM", "MG", "PHOS", "TSH" in the last 8760 hours. Liver Function Tests: No results for input(s): "AST", "ALT", "ALKPHOS", "BILITOT", "PROT", "ALBUMIN" in the last 8760 hours. No results for input(s): "LIPASE", "AMYLASE" in the last 8760 hours. No results for input(s): "AMMONIA" in the last 8760 hours. CBC: No results for input(s): "WBC", "NEUTROABS", "HGB", "HCT", "MCV", "PLT" in the last 8760 hours. Lipid Panel: No results for input(s): "CHOL", "HDL", "LDLCALC", "TRIG", "CHOLHDL", "LDLDIRECT"  in the last 8760 hours. No results found for: "HGBA1C"  Procedures since last visit: No results found. Results   LABS Hb: 11.7 (12/06/2023) Na: 138 (12/06/2023) K: 4.3 (12/06/2023) Cr: 0.8 (12/06/2023) GFR: 68 (12/06/2023) TSH: 0.7 (12/06/2023)      Assessment/Plan    Knee pain Intermittent knee pain for a couple of weeks, exacerbated by bending. No specific injury reported. Examination reveals no tenderness or effusion on the left knee, but a fall on the right knee with a small scab present. Possible need for strengthening exercises or further imaging if ligament involvement is suspected. - Order knee x-ray to assess for structural issues. - Refer to physical therapy for knee strengthening exercises.  Chronic kidney disease stage 3A Chronic kidney disease stage 3A with GFR at 59 mL/min/1.60m, likely due to age-related changes and past medical history. - BMP and Vitamin D  Anemia Mild anemia with hemoglobin at 11.7 g/dL, slightly below normal range. - Monitor hemoglobin levels in future lab work.  Osteopenia Osteopenia noted, with previous discussion about vitamin D testing. She declined vitamin D testing due to lack of prior communication about its  necessity. - Consider vitamin D level testing in future lab work if indicated.  Hypothyroidism post-thyroidectomy Post-thyroidectomy hypothyroidism managed with levothyroxine. Thyroid  function tests are within normal limits. - Continue current levothyroxine dosage.  Macular degeneration Macular degeneration managed with regular follow-ups. No recent injections required. Last follow-up extended to one year, indicating stable condition. - Continue regular follow-ups with ophthalmologist as scheduled.       Labs/tests ordered:  * No order type specified * Next appt:  04/06/2024  I spent greater than 60 minutes for the care of this patient in face to face time, chart review, clinical documentation, patient education.

## 2024-01-03 ENCOUNTER — Telehealth: Payer: Self-pay | Admitting: *Deleted

## 2024-01-03 DIAGNOSIS — M25561 Pain in right knee: Secondary | ICD-10-CM

## 2024-01-03 NOTE — Telephone Encounter (Signed)
 Patient called and stated that you had told her to schedule a Knee Xray to have done.  Patient is wanting to know where this needs to be done and when. Patient wanted to know if she should just have it done before her appointment in September.   I didn't see an order for the x-ray.  Please Advise.

## 2024-01-04 NOTE — Telephone Encounter (Signed)
 Patient should have x-ray done at the following address at her soonest convenience. The order has been placed.  Address: [address] [place], [place] [address] Phone: [phone]

## 2024-01-05 NOTE — Telephone Encounter (Signed)
 Spoke with patient and before I could say anything beyond my introduction patient began yelling that she was cleaning her teeth and would need to call us  back.  When patient calls back Dr.Beamer's message should be shared with her.

## 2024-01-05 NOTE — Telephone Encounter (Addendum)
 Spoke with patient and she was informed that email is not an allowed from of communication. Patient was informed about mychart and stated that is not an option for her as the writing is always too small.   Patient asked that I mail her the address and she will arrange transportation to go get xray. Information mailed (copied into a word document that allows me to make the print large to accommodate patients vision problem).

## 2024-01-05 NOTE — Telephone Encounter (Signed)
 Copied from CRM [id number]. Topic: Clinical - Lab/Test Results >> Jan 05, 2024  9:32 AM Danelle Dunning F wrote: Reason for CRM:   Patient is calling to confirm the reason for recent call; Patient wanted to ensure she can have the address and phone number of the facility she will be needing to attend for x-rays. The patient would like to have the information emailed due to having trouble with her vision (please send in large font).  Patient is not yelling she is having trouble hearing with her hearing device.   Email: [email or web address]   [facility] [address] [place],  [place]  [address]  Main: [phone]  Callback Number: [phone]

## 2024-01-09 ENCOUNTER — Telehealth: Payer: Self-pay

## 2024-01-09 MED ORDER — LISINOPRIL-HYDROCHLOROTHIAZIDE 10-12.5 MG PO TABS: 1.0000 | ORAL_TABLET | Freq: Every day | ORAL | 1 refills | Status: DC

## 2024-01-09 MED ORDER — LEVOTHYROXINE SODIUM 125 MCG PO TABS: 125.0000 ug | ORAL_TABLET | Freq: Every day | ORAL | 1 refills | Status: DC

## 2024-01-09 MED ORDER — ATORVASTATIN CALCIUM 20 MG PO TABS: 20.0000 mg | ORAL_TABLET | Freq: Every day | ORAL | 1 refills | Status: DC

## 2024-01-09 NOTE — Telephone Encounter (Signed)
 Copied from CRM [id number]. Topic: Clinical - Medication Question >> Jan 09, 2024 10:43 AM [name] H wrote: Reason for CRM: Patient is needing a nurse to call her back about her medicine, she would not say what it was for or about she just wants to speak with a nurse, patient callback number is [phone]

## 2024-01-13 ENCOUNTER — Encounter: Payer: Self-pay | Admitting: Student

## 2024-01-13 ENCOUNTER — Ambulatory Visit: Admitting: Student

## 2024-01-13 VITALS — BP 132/80 | HR 79 | Temp 97.5°F | Ht <= 58 in | Wt 162.0 lb

## 2024-01-13 DIAGNOSIS — B379 Candidiasis, unspecified: Secondary | ICD-10-CM | POA: Insufficient documentation

## 2024-01-13 DIAGNOSIS — M25562 Pain in left knee: Secondary | ICD-10-CM

## 2024-01-13 DIAGNOSIS — M25561 Pain in right knee: Secondary | ICD-10-CM | POA: Diagnosis not present

## 2024-01-13 DIAGNOSIS — L900 Lichen sclerosus et atrophicus: Secondary | ICD-10-CM | POA: Insufficient documentation

## 2024-01-13 DIAGNOSIS — B351 Tinea unguium: Secondary | ICD-10-CM | POA: Diagnosis not present

## 2024-01-13 MED ORDER — FLUCONAZOLE 150 MG PO TABS: 150.0000 mg | ORAL_TABLET | ORAL | 0 refills | Status: AC

## 2024-01-13 MED ORDER — CLOBETASOL PROPIONATE 0.05 % EX CREA: TOPICAL_CREAM | CUTANEOUS | 3 refills | Status: DC

## 2024-01-13 NOTE — Progress Notes (Signed)
 [facility]  Provider: Jann Melody  Code Status: DNR Goals of Care:     12/30/2023    9:23 AM  Advanced Directives  Does Patient Have a Medical Advance Directive? Yes  Type of Estate agent of [place];Out of facility DNR (pink MOST or yellow form);Living will  Does patient want to make changes to medical advance directive? No - Patient declined  Copy of Healthcare Power of Attorney in Chart? No - copy requested     Chief Complaint  Patient presents with   Foot Problem    Itchy feet, knee concerns, raised area in vaginal area,     HPI: Patient is a 89 y.o. female seen today for an acute visit.  Discussed the use of AI scribe software for clinical note transcription with the patient, who gave verbal consent to proceed.  History of Present Illness   Yolanda Wade is a 89 year old female who presents with knee pain and itching skin.  She experiences bilateral knee pain, primarily located in the middle of the muscle on both sides. The pain is persistent and requires her to get off her feet immediately. She is scheduled for x-rays to further investigate the cause of the pain. During a previous evaluation, no physical abnormalities were found in her knees.  She experiences significant itching in her feet, particularly at night, despite using creams such as a generic athlete's foot cream, itch cream, and Gold Bond itch cream. The skin on her feet is dry and itchy, but there is no burning sensation. She recently had a pedicure and has been using moisturizing creams.  She mentions a history of a rash in the groin area, initially thought to be a spider bite. The rash has improved with cream application. She also reports bumps in the vaginal area, discovered about a week ago, which are not painful or tender. She has not had any genital warts in the past and has not had a gynecological exam or Pap smear in over 20 years.  She has a history of  chronic itching in the vaginal area, which has been present for years. She recalls visiting a bacterial specialist who provided cream to soothe the irritation. The itching is persistent, requiring self-control to avoid scratching. No burning sensation in her feet and no tenderness or pain associated with the bumps in the vaginal area.        Past Medical History:  Diagnosis Date   Basal cell carcinoma 09/22/2021   L neck, [facility]   Chicken pox    Hyperlipidemia    Hypertension    Hypothyroidism    Osteopenia    Thyroid  cancer (HCC) 06/2017   per patient    Past Surgical History:  Procedure Laterality Date   CARPAL TUNNEL RELEASE Right    CATARACT EXTRACTION Bilateral    DILATION AND CURETTAGE OF UTERUS     for post meopausal bleeding   EYE SURGERY     HALLUX VALGUS AUSTIN     IR KYPHO LUMBAR INC FX REDUCE BONE BX UNI/BIL CANNULATION INC/IMAGING  08/03/2022   IR RADIOLOGIST EVAL & MGMT  07/06/2022   IR RADIOLOGIST EVAL & MGMT  08/03/2022   IR RADIOLOGIST EVAL & MGMT  08/19/2022   KNEE CARTILAGE SURGERY Right    UMBILICAL HERNIA REPAIR N/A 05/23/2015   Procedure: HERNIA REPAIR UMBILICAL ADULT;  Surgeon: Benancio Bracket, MD;  Location: [facility];  Service: General;  Laterality: N/A;  No Known Allergies  Outpatient Encounter Medications as of 01/13/2024  Medication Sig   atorvastatin (LIPITOR) 20 MG tablet Take 1 tablet (20 mg total) by mouth daily.   clobetasol cream (TEMOVATE) 0.05 % Apply 1 Application topically 2 (two) times daily for 7 days then 1 Application 2 (two) times a week.   fluconazole (DIFLUCAN) 150 MG tablet Take 1 tablet (150 mg total) by mouth every 3 (three) days for 3 doses.   levothyroxine (SYNTHROID) 125 MCG tablet Take 1 tablet (125 mcg total) by mouth daily before breakfast.   lisinopril-hydrochlorothiazide (ZESTORETIC) 10-12.5 MG tablet Take 1 tablet by mouth daily.   Multiple Vitamins-Minerals (PRESERVISION AREDS PO) Take 1 tablet by mouth 2 (two) times  daily.   No facility-administered encounter medications on file as of 01/13/2024.    Review of Systems:  Review of Systems  Health Maintenance  Topic Date Due   Zoster Vaccines- Shingrix (1 of 2) 05/06/1945   DEXA SCAN  Never done   Pneumococcal Vaccine: 50+ Years (2 of 2 - PCV) 03/26/2006   Medicare Annual Wellness (AWV)  03/22/2017   COVID-19 Vaccine (6 - 2024-25 season) 12/30/2023   INFLUENZA VACCINE  02/24/2024   DTaP/Tdap/Td (3 - Td or Tdap) 09/21/2031   HPV VACCINES  Aged Out   Meningococcal B Vaccine  Aged Out    Physical Exam: Vitals:   01/13/24 1000  BP: 132/80  Pulse: 79  Temp: (!) 97.5 F (36.4 C)  SpO2: 95%  Weight: 162 lb (73.5 kg)  Height: 4' 10 (1.473 m)   Body mass index is 33.86 kg/m. Physical Exam Physical Exam   GENITOURINARY: Vaginal bumps present. Labia irritated and red on the inside, possibly from scratching. Vaginal pustule present. Lichenification of vaginal skin. SKIN: Fungal infection in toenails.       Labs reviewed: Basic Metabolic Panel: No results for input(s): NA, K, CL, CO2, GLUCOSE, BUN, CREATININE, CALCIUM, MG, PHOS, TSH in the last 8760 hours. Liver Function Tests: No results for input(s): AST, ALT, ALKPHOS, BILITOT, PROT, ALBUMIN in the last 8760 hours. No results for input(s): LIPASE, AMYLASE in the last 8760 hours. No results for input(s): AMMONIA in the last 8760 hours. CBC: No results for input(s): WBC, NEUTROABS, HGB, HCT, MCV, PLT in the last 8760 hours. Lipid Panel: No results for input(s): CHOL, HDL, LDLCALC, TRIG, CHOLHDL, LDLDIRECT in the last 8760 hours. No results found for: HGBA1C  Procedures since last visit: No results found. 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 Differential diagnosis includes lichen planus and lichen sclerosus. Excoriation exacerbates dermal irritation and thickening. - Prescribe topical corticosteroid cream to be applied daily for one week, then reduce to twice weekly. - Follow up in one month to evaluate treatment efficacy.  Vulvar candidiasis Erythema and irritation in the vaginal area, likely secondary to a yeast infection. Chronic pruritus may contribute to irritation. Candidiasis suspected as a contributing factor. - Prescribe oral antifungal therapy.  Pruritus of feet Chronic nocturnal pruritus of the feet without associated burning. Skin is xerotic. She has been using various emollients, including Gold Bond itch cream. Pruritus worsens with excoriation. - Continue Gold Bond itch cream for hydration and pruritus relief.  Fungal infection of toenails Onychomycosis with  potential progression to tinea pedis. She discontinued a generic antifungal cream after one week due to perceived inefficacy. Fungal infections necessitate extended treatment duration. - Advise continuation of antifungal cream for a minimum of one month.  Knee pain Bilateral knee arthralgia with no definitive physical findings. Awaiting radiographic evaluation to inform further management. Sports medicine referral deferred pending x-ray results. - Obtain knee x-rays to investigate etiology of pain. - Consider sports medicine referral based on radiographic findings.  Age-related macular degeneration, dry type Dry age-related macular degeneration with gradual progression. Last ophthalmology evaluation was one year ago with a recommended follow-up in one year.          Labs/tests ordered:  * No order type specified * Next appt:  02/08/2024

## 2024-01-13 NOTE — Patient Instructions (Signed)
 VISIT SUMMARY:  Today, you were seen for knee pain and itching skin. We discussed your symptoms, including the pain in your knees and the itching on your feet and in the vaginal area. We have planned further investigations and treatments to help manage these issues.  YOUR PLAN:  -VULVAR LICHENIFICATION: This condition involves thickening of the skin in the vulvar area due to chronic itching and scratching. You will use a topical corticosteroid cream daily for one week, then reduce to twice weekly. We will follow up in one month to see how the treatment is working.  -VULVAR CANDIDIASIS: This is a yeast infection in the vaginal area causing redness and irritation. You will take an oral antifungal medication to treat this infection.  -PRURITUS OF FEET: This is chronic itching of the feet, especially at night, without a burning sensation. Continue using Gold Bond itch cream to keep your skin hydrated and relieve the itching.  -FUNGAL INFECTION OF TOENAILS: This is a fungal infection of the toenails that may spread to the feet. You should continue using the antifungal cream for at least one month to effectively treat the infection.  -KNEE PAIN: You have pain in both knees, and we are waiting for x-ray results to understand the cause. Depending on the results, we may refer you to a sports medicine specialist.  -AGE-RELATED MACULAR DEGENERATION, DRY TYPE: This is a gradual loss of vision due to aging. Your last eye exam was a year ago, and you should follow up with your eye doctor in one year.  INSTRUCTIONS:  Please follow up in one month to evaluate the effectiveness of the treatment for vulvar lichenification. Continue using the prescribed medications as directed. We will review your knee x-ray results once they are available and decide on the next steps. Schedule an eye exam with your ophthalmologist in one year.

## 2024-01-16 ENCOUNTER — Ambulatory Visit
Admission: RE | Admit: 2024-01-16 | Discharge: 2024-01-16 | Disposition: A | Discharge status: Home / Self Care | Attending: Student | Admitting: Student

## 2024-01-16 ENCOUNTER — Ambulatory Visit
Admission: RE | Admit: 2024-01-16 | Discharge: 2024-01-16 | Disposition: A | Discharge status: Home / Self Care | Source: Ambulatory Visit | Attending: Student | Admitting: Student

## 2024-01-16 ENCOUNTER — Other Ambulatory Visit: Payer: Self-pay | Admitting: Student

## 2024-01-16 DIAGNOSIS — M25562 Pain in left knee: Secondary | ICD-10-CM

## 2024-01-16 DIAGNOSIS — M25561 Pain in right knee: Secondary | ICD-10-CM | POA: Diagnosis present

## 2024-01-25 ENCOUNTER — Ambulatory Visit: Payer: Self-pay | Admitting: Student

## 2024-01-25 DIAGNOSIS — M1710 Unilateral primary osteoarthritis, unspecified knee: Secondary | ICD-10-CM

## 2024-02-08 ENCOUNTER — Ambulatory Visit: Admitting: Student

## 2024-02-08 ENCOUNTER — Encounter: Payer: Self-pay | Admitting: Student

## 2024-02-08 VITALS — BP 138/86 | HR 68 | Temp 98.0°F | Ht <= 58 in | Wt 162.0 lb

## 2024-02-08 DIAGNOSIS — F4320 Adjustment disorder, unspecified: Secondary | ICD-10-CM | POA: Diagnosis not present

## 2024-02-08 DIAGNOSIS — G479 Sleep disorder, unspecified: Secondary | ICD-10-CM

## 2024-02-08 DIAGNOSIS — N952 Postmenopausal atrophic vaginitis: Secondary | ICD-10-CM | POA: Diagnosis not present

## 2024-02-08 DIAGNOSIS — S838X9S Sprain of other specified parts of unspecified knee, sequela: Secondary | ICD-10-CM | POA: Diagnosis not present

## 2024-02-08 MED ORDER — SERTRALINE HCL 50 MG PO TABS: ORAL_TABLET | ORAL | 0 refills | Status: AC

## 2024-02-08 MED ORDER — ESTROGENS CONJUGATED 0.625 MG/GM VA CREA: 1.0000 | TOPICAL_CREAM | VAGINAL | 12 refills | Status: DC

## 2024-02-08 NOTE — Progress Notes (Signed)
 [facility] Provider: ABDUL  Code Status: DNR Goals of Care:     12/30/2023    9:23 AM  Advanced Directives  Does Patient Have a Medical Advance Directive? Yes  Type of Estate agent of [place];Out of facility DNR (pink MOST or yellow form);Living will  Does patient want to make changes to medical advance directive? No - Patient declined  Copy of Healthcare Power of Attorney in Chart? No - copy requested     Chief Complaint  Patient presents with   Medical Management of Chronic Issues    Medical Management of Chronic Issues. 1 Month follow up. Complains of Vaginal Itching.     HPI: Patient is a 89 y.o. female seen today for medical management of chronic diseases.   Discussed the use of AI scribe software for clinical note transcription with the patient, who gave verbal consent to proceed.  History of Present Illness   Yolanda Wade is a 89 year old female who presents with knee pain and irritability.   She experiences knee pain, particularly during certain rotations of the knee. She avoids bending the knee. The pain is not constant and does not bother her when sitting or sleeping but occurs with specific movements. She has been scheduled for physical therapy sessions throughout the next month.  She describes a recent episode of irritability and anger, attributed to a misunderstanding with her home care schedule. This situation caused significant distress, leading her to request medication to help manage her mood.  She reports difficulty sleeping, stating that her days are mixed up, and she often sleeps during the day and is awake at night. She attributes this to her mind being too active.  She mentions a persistent itchy sensation in her vaginal area, which was not alleviated by previous yeast tablets. She has not used topical estrogen but has tried a vaginal itch cream without success.  She discusses a past dental issue  with a tooth that was not causing pain, and she opted not to have it treated after being informed of the potential need for extraction.         Past Medical History:  Diagnosis Date   Basal cell carcinoma 09/22/2021   L neck, [facility]   Chicken pox    Hyperlipidemia    Hypertension    Hypothyroidism    Osteopenia    Thyroid  cancer (HCC) 06/2017   per patient    Past Surgical History:  Procedure Laterality Date   CARPAL TUNNEL RELEASE Right    CATARACT EXTRACTION Bilateral    DILATION AND CURETTAGE OF UTERUS     for post meopausal bleeding   EYE SURGERY     HALLUX VALGUS [place]     IR KYPHO LUMBAR INC FX REDUCE BONE BX UNI/BIL CANNULATION INC/IMAGING  08/03/2022   IR RADIOLOGIST EVAL & MGMT  07/06/2022   IR RADIOLOGIST EVAL & MGMT  08/03/2022   IR RADIOLOGIST EVAL & MGMT  08/19/2022   KNEE CARTILAGE SURGERY Right    UMBILICAL HERNIA REPAIR N/A 05/23/2015   Procedure: HERNIA REPAIR UMBILICAL ADULT;  Surgeon: Larinda Unknown Sharps, MD;  Location: [facility] ORS;  Service: General;  Laterality: N/A;    No Known Allergies  Outpatient Encounter Medications as of 02/08/2024  Medication Sig   atorvastatin  (LIPITOR) 20 MG tablet Take 1 tablet (20 mg total) by mouth daily.   clobetasol  cream (TEMOVATE ) 0.05 % Apply 1 Application topically 2 (two) times daily  for 7 days then 1 Application 2 (two) times a week.   [START ON 02/09/2024] conjugated estrogens  (PREMARIN ) vaginal cream Place 1 Applicatorful vaginally 2 (two) times a week.   levothyroxine  (SYNTHROID ) 125 MCG tablet Take 1 tablet (125 mcg total) by mouth daily before breakfast.   lisinopril -hydrochlorothiazide  (ZESTORETIC ) 10-12.5 MG tablet Take 1 tablet by mouth daily.   Multiple Vitamins-Minerals (PRESERVISION AREDS PO) Take 1 tablet by mouth 2 (two) times daily.   sertraline  (ZOLOFT ) 50 MG tablet Take 0.5 tablets (25 mg total) by mouth daily for 7 days, THEN 1 tablet (50 mg total) daily for 7 days, THEN 2 tablets (100 mg total) daily for 14  days.   No facility-administered encounter medications on file as of 02/08/2024.    Review of Systems:  Review of Systems  Health Maintenance  Topic Date Due   Zoster Vaccines- Shingrix (1 of 2) 05/06/1945   DEXA SCAN  Never done   Pneumococcal Vaccine: 50+ Years (2 of 2 - PCV) 03/26/2006   Medicare Annual Wellness (AWV)  03/22/2017   COVID-19 Vaccine (6 - 2024-25 season) 12/30/2023   INFLUENZA VACCINE  02/24/2024   DTaP/Tdap/Td (3 - Td or Tdap) 09/21/2031   Hepatitis B Vaccines  Aged Out   HPV VACCINES  Aged Out   Meningococcal B Vaccine  Aged Out    Physical Exam: Vitals:   02/08/24 1248  BP: 138/86  Pulse: 68  Temp: 98 F (36.7 C)  SpO2: 97%  Weight: 162 lb (73.5 kg)  Height: 4' 10 (1.473 m)   Body mass index is 33.86 kg/m. Physical Exam Constitutional:      Appearance: Normal appearance.  Cardiovascular:     Rate and Rhythm: Normal rate and regular rhythm.     Pulses: Normal pulses.     Heart sounds: Normal heart sounds.  Pulmonary:     Effort: Pulmonary effort is normal.  Abdominal:     General: Abdomen is flat. Bowel sounds are normal.     Palpations: Abdomen is soft.  Musculoskeletal:        General: No swelling or tenderness.  Skin:    General: Skin is warm and dry.  Neurological:     Mental Status: She is alert and oriented to person, place, and time.     Gait: Gait normal.  Psychiatric:        Mood and Affect: Mood normal.    Physical Exam          Labs reviewed: Basic Metabolic Panel: No results for input(s): NA, K, CL, CO2, GLUCOSE, BUN, CREATININE, CALCIUM , MG, PHOS, TSH in the last 8760 hours. Liver Function Tests: No results for input(s): AST, ALT, ALKPHOS, BILITOT, PROT, ALBUMIN in the last 8760 hours. No results for input(s): LIPASE, AMYLASE in the last 8760 hours. No results for input(s): AMMONIA in the last 8760 hours. CBC: No results for input(s): WBC, NEUTROABS, HGB, HCT, MCV,  PLT in the last 8760 hours. Lipid Panel: No results for input(s): CHOL, HDL, LDLCALC, TRIG, CHOLHDL, LDLDIRECT in the last 8760 hours. No results found for: HGBA1C  Procedures since last visit: DG Knee Complete 4 Views Left Result Date: 01/22/2024 CLINICAL DATA:  Chronic bilateral knee pain. EXAM: LEFT KNEE - COMPLETE 4+ VIEW COMPARISON:  None Available. FINDINGS: Moderate to advanced medial tibiofemoral joint space narrowing. Mild to moderate tricompartmental peripheral spurring. No fracture, erosion, or focal bone abnormality. Minimal joint effusion. Peripheral vascular calcifications. 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 Electronically  Signed   By: Andrea Gasman M.D.   On: 01/22/2024 12:02   DG Knee Complete 4 Views Right Result Date: 01/22/2024 CLINICAL DATA:  Bilateral knee pain. EXAM: RIGHT KNEE - COMPLETE 4+ VIEW COMPARISON:  None Available. FINDINGS: Moderate to advanced medial tibiofemoral joint space narrowing. Mild to moderate tricompartmental peripheral spurring. Subchondral cyst in the medial tibial plateau. No fracture, erosion, or focal bone abnormality. Small joint effusion. Peripheral vascular calcifications. 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 Small joint effusion. Electronically Signed   By: Andrea Gasman M.D.   On: 01/22/2024 11:59   Results          Assessment/Plan     Grief Response Increased irritability and mood disturbance due to recent changes in home care services. She is also noting this could be due to the loss of her spouse earlier this year. - Prescribe sertraline  (Zoloft ) starting at 25 mg (half of a 50 mg tablet) daily for one week, then increase to 50 mg (one 50 mg tablet) daily for one week, and then 100 mg (two 50 mg tablets) daily. - Discuss potential side effects of sertraline , including possible  drowsiness. - Advise taking sertraline  at night initially to assess for drowsiness. - Follow up in one month to assess mood and medication efficacy.  Sleep disturbance Difficulty sleeping at night with daytime somnolence, possibly contributing to mood disturbance. - Monitor sleep patterns and adjust sertraline  dosing as needed based on drowsiness.  Potential Meniscal tear Chronic meniscal tear causing pain with certain knee movements. Prefers to avoid knee replacement surgery. Physical therapy is ongoing and provides some relief.  - Continue physical therapy. - Refer to orthopedic specialist for further evaluation and management options.  Vaginal itching Persistent vaginal itching not relieved by previous treatment. Possible atrophic vaginitis due to decreased estrogen levels. - Trial of topical estrogen to alleviate symptoms.       Labs/tests ordered:  * No order type specified * Next appt:  03/14/2024

## 2024-02-08 NOTE — Patient Instructions (Addendum)
 For an evaluation with orthopedics for your knee pain consider contacting Dr. Dianne office: Yolanda Wade - Kennard Kayla Pinal MD [address] [place] [address]  [phone] or [phone]  For Sertraline  (Zoloft )  Take 1/2 tablet for 1 week then take 1 tablet for 1 week then take 2 tablets daily as long as you have tolerated well.   Estrogen Cream Apply vaginally twice weekly

## 2024-02-15 ENCOUNTER — Telehealth: Payer: Self-pay | Admitting: *Deleted

## 2024-02-15 NOTE — Telephone Encounter (Signed)
 Not a patient at our office, [facility].

## 2024-02-15 NOTE — Telephone Encounter (Signed)
 Copied from CRM #[id number]. Topic: Clinical - Medication Question >> Feb 15, 2024 12:39 PM Mercer PEDLAR wrote: Reason for CRM: Patient would like a callback to discuss how to take her medication. She was not able to provide the name of the medication, she stated that she has poor vision and can't see the name of the medication.

## 2024-02-20 ENCOUNTER — Telehealth: Admitting: *Deleted

## 2024-02-20 DIAGNOSIS — N952 Postmenopausal atrophic vaginitis: Secondary | ICD-10-CM

## 2024-02-20 DIAGNOSIS — E2839 Other primary ovarian failure: Secondary | ICD-10-CM

## 2024-02-20 NOTE — Telephone Encounter (Signed)
 Copied from CRM #[id number]. Topic: General - Other >> Feb 20, 2024 12:44 PM Suzette B wrote: Reason for CRM: patient called stating she was retuning a call to Ms. Donzell Demarlo Riojas, Patient stated she had missed the called, Please call [phone]    Tried calling patient, LMOM to return call.

## 2024-02-20 NOTE — Telephone Encounter (Signed)
Tried calling patient. LMOM to return call.  °

## 2024-02-20 NOTE — Telephone Encounter (Signed)
 Copied from CRM [id number]. Topic: Clinical - Medication Question >> Feb 20, 2024 11:47 AM Zane F wrote: Reason for CRM:   Patient is calling in hopes of discussing the matter of her recent medications with someone in the office. Patient has limited vision and due to this would like someone to discuss her prescription instructions verbally for understanding.   Medications In question (possibly):   1. sertraline  (ZOLOFT ) 50 MG tablet (stopped taking the prescription due to how it made her feel) 2. conjugated estrogens  (PREMARIN ) vaginal cream (the prescription was $170 for the co-pay but the patient was unable to pick it up due to cost.)  Patient would like an alternative estrogen cream that her insurance covers.    Callback Number: [id number]

## 2024-02-21 NOTE — Telephone Encounter (Signed)
 Please advise on request to change estrogen cream and patient stopping zoloft .

## 2024-02-22 NOTE — Telephone Encounter (Signed)
 Patient is calling because she has not heard back after returning call last time. She would like to speak with nurse or Dr. Abdul. Please callback.

## 2024-02-22 NOTE — Telephone Encounter (Signed)
 Left message on voicemail for patient to return call when available . Reason for call was to share Dr.Beamer's response with patient (agent can see and share with patient when call returned).

## 2024-02-22 NOTE — Telephone Encounter (Signed)
 Thank you for this message. I will have to hear back from pharmacy about pricing options. The options for topical estrogens  are limited.   Best,  VB

## 2024-03-04 MED ORDER — ESTRADIOL 0.1 MG/GM VA CREA: 1.0000 | TOPICAL_CREAM | VAGINAL | 2 refills | Status: AC

## 2024-03-04 NOTE — Telephone Encounter (Signed)
 Received message from the pharmacy I can give you a private price without insurance. Estradiol  cream 42.5gm is $91.00 - 206.00. Premarin  30gm is $457.48. So unfortunately they are quite expensive. She will have to communicate with insurance about which option could work better. I can put in the rx for estradiol  so insurance can run it. If we need a PA, that is also fine.

## 2024-03-04 NOTE — Addendum Note (Signed)
 Addended by: Claudett Bayly on: 03/04/2024 10:56 AM   Modules accepted: Orders

## 2024-03-05 NOTE — Telephone Encounter (Signed)
 Left message on voicemail for patient to return call when available . Reason for call was to share Dr.Beamer's response with patient (agent can see and share with patient when call returned).

## 2024-03-06 NOTE — Telephone Encounter (Signed)
 I have spoken to patient and she said that these are expensive medications. However she's fine with Estradiol  Rx being sent to pharmacy. If PA is needed we will see if insurance can cover it. Message routed back to PCP Abdul Fine, MD

## 2024-03-07 NOTE — Telephone Encounter (Addendum)
 Patient doesn't need Prior Authorization for Estradiol  cream. Pharmacy states that medication is ready for pick up. I have called patient to notify her however she didn't pick up the phone. I have left voicemail with office call back number. Message routed to PCP Abdul Fine, MD

## 2024-03-07 NOTE — Telephone Encounter (Signed)
 Called patient and no answer. Voicemail was left with office call back number. I have called and spoken to the pharmacy and they stated that Estradiol  is $67.79 for 90 day supply and with coupon medication is $43. The pharmacist states that this price goes towards patient deductible because it hasn't been met.

## 2024-03-07 NOTE — Telephone Encounter (Signed)
 Copied from CRM [id number]. Topic: Clinical - Medication Question >> Feb 20, 2024 11:47 AM Zane F wrote: Reason for CRM:   Patient is calling in hopes of discussing the matter of her recent medications with someone in the office. Patient has limited vision and due to this would like someone to discuss her prescription instructions verbally for understanding.   Medications In question (possibly):   1. sertraline  (ZOLOFT ) 50 MG tablet (stopped taking the prescription due to how it made her feel) 2. conjugated estrogens  (PREMARIN ) vaginal cream (the prescription was $170 for the co-pay but the patient was unable to pick it up due to cost.)  Patient would like an alternative estrogen cream that her insurance covers.    Callback Number: [id number] >> Mar 07, 2024 12:01 PM Zane F wrote: Patient is calling back after missing a call from the office while she was in Physical Therapy. Patient would like to confirm if the clinical staff discussed the matter of the cost of the prescription in question with the pharmacy. Call was warm transferred to CAL but the patient disconnected the call while on hold.   Callback Number: [id number]

## 2024-03-08 NOTE — Telephone Encounter (Signed)
 Message routed to clinical intake for today Bethany.B/CMA to call back and follow up.

## 2024-03-09 NOTE — Telephone Encounter (Signed)
 Spoke with patient this morning and she stated that she was able to pick the medication from the pharmacy. Patient have no future question at this time.  Message sent to Abdul Fine, MD

## 2024-03-14 ENCOUNTER — Encounter: Admitting: Student

## 2024-03-23 ENCOUNTER — Encounter: Payer: Self-pay | Admitting: Student

## 2024-03-23 ENCOUNTER — Ambulatory Visit: Admitting: Student

## 2024-03-23 VITALS — BP 138/84 | HR 74 | Temp 97.5°F | Ht <= 58 in | Wt 165.0 lb

## 2024-03-23 DIAGNOSIS — N952 Postmenopausal atrophic vaginitis: Secondary | ICD-10-CM

## 2024-03-23 DIAGNOSIS — G3184 Mild cognitive impairment, so stated: Secondary | ICD-10-CM | POA: Diagnosis not present

## 2024-03-23 DIAGNOSIS — H548 Legal blindness, as defined in USA: Secondary | ICD-10-CM

## 2024-03-23 DIAGNOSIS — F32A Depression, unspecified: Secondary | ICD-10-CM

## 2024-03-23 NOTE — Progress Notes (Signed)
 [facility] Provider: [facility]BD[facility]  Code Status: DNR Goals of Care:     03/23/2024    9:07 AM  Advanced Directives  Does Patient Have a Medical Advance Directive? No  Would patient like information on creating a medical advance directive? No - Patient declined     Chief Complaint  Patient presents with   Medical Management of Chronic Issues    Medical Management of Chronic Issues. 1 Month follow up. Complains of dizziness. To discuss need for Zoster, pneumonia, Covid, Flu, Dexa and AWV    HPI: Patient is a 89 y.o. female seen today for medical management of chronic diseases.   Discussed the use of AI scribe software for clinical note transcription with the patient, who gave verbal consent to proceed.  History of Present Illness   Yolanda Wade is a 89 year old female who presents for follow-up regarding medication management and vision changes.  She is currently using a steroid cream but is unsure of its specific purpose. She recalls using it previously for vaginal atrophy without effect. She recalls being told that one of her medications was for her breasts, but is unsure which one.  She was prescribed sertraline  for mood changes in July, but does not recall taking it or experiencing any effects.  She describes experiencing vision changes, specifically seeing patterns that do not match her surroundings. She has had cataracts removed in the past and is concerned about the progression of her vision changes. She feels that her concerns were not adequately addressed during a previous eye appointment.  She mentions recent memory changes, including difficulty with certain cognitive tasks and memory recall. She describes a recent test where she struggled with some of the tasks and lost points, which she found frustrating.  She discusses her social situation, mentioning that she no longer drives and has difficulty attending church due to transportation issues.  She has home care assistance from a long-time acquaintance who helps with errands and household tasks. She expresses frustration with previous home care services and has since arranged for personal assistance from someone she trusts.         Past Medical History:  Diagnosis Date   Basal cell carcinoma 09/22/2021   L neck, [facility]   Chicken pox    Hyperlipidemia    Hypertension    Hypothyroidism    Osteopenia    Thyroid  cancer (HCC) 06/2017   per patient    Past Surgical History:  Procedure Laterality Date   CARPAL TUNNEL RELEASE Right    CATARACT EXTRACTION Bilateral    DILATION AND CURETTAGE OF UTERUS     for post meopausal bleeding   EYE SURGERY     HALLUX VALGUS [place]     IR KYPHO LUMBAR INC FX REDUCE BONE BX UNI/BIL CANNULATION INC/IMAGING  08/03/2022   IR RADIOLOGIST EVAL & MGMT  07/06/2022   IR RADIOLOGIST EVAL & MGMT  08/03/2022   IR RADIOLOGIST EVAL & MGMT  08/19/2022   KNEE CARTILAGE SURGERY Right    UMBILICAL HERNIA REPAIR N/A 05/23/2015   Procedure: HERNIA REPAIR UMBILICAL ADULT;  Surgeon: Larinda Unknown Sharps, MD;  Location: [facility];  Service: General;  Laterality: N/A;    No Known Allergies  Outpatient Encounter Medications as of 03/23/2024  Medication Sig   atorvastatin  (LIPITOR) 20 MG tablet Take 1 tablet (20 mg total) by mouth daily.   clobetasol  cream (TEMOVATE ) 0.05 % Apply 1 Application topically 2 (two) times daily  for 7 days then 1 Application 2 (two) times a week.   estradiol  (ESTRACE  VAGINAL) 0.1 MG/GM vaginal cream Place 1 Applicatorful vaginally 3 (three) times a week.   hydrocortisone  2.5 % cream Apply 1 Application topically 2 (two) times daily.   levothyroxine  (SYNTHROID ) 125 MCG tablet Take 1 tablet (125 mcg total) by mouth daily before breakfast.   lisinopril -hydrochlorothiazide  (ZESTORETIC ) 10-12.5 MG tablet Take 1 tablet by mouth daily.   Multiple Vitamins-Minerals (PRESERVISION AREDS PO) Take 1 tablet by mouth 2 (two) times daily.   sertraline   (ZOLOFT ) 50 MG tablet Take 0.5 tablets (25 mg total) by mouth daily for 7 days, THEN 1 tablet (50 mg total) daily for 7 days, THEN 2 tablets (100 mg total) daily for 14 days.   No facility-administered encounter medications on file as of 03/23/2024.    Review of Systems:  Review of Systems  Health Maintenance  Topic Date Due   Zoster Vaccines- Shingrix (1 of 2) 05/06/1945   DEXA SCAN  Never done   Pneumococcal Vaccine: 50+ Years (2 of 2 - PCV) 03/26/2006   Medicare Annual Wellness (AWV)  03/22/2017   COVID-19 Vaccine (6 - 2024-25 season) 12/30/2023   INFLUENZA VACCINE  02/24/2024   DTaP/Tdap/Td (3 - Td or Tdap) 09/21/2031   HPV VACCINES  Aged Out   Meningococcal B Vaccine  Aged Out    Physical Exam: Vitals:   03/23/24 0902  BP: 138/84  Pulse: 74  Temp: (!) 97.5 F (36.4 C)  SpO2: 96%  Weight: 165 lb (74.8 kg)  Height: 4' 10 (1.473 m)   Body mass index is 34.49 kg/m. Physical Exam Physical Exam          Labs reviewed: Basic Metabolic Panel: No results for input(s): NA, K, CL, CO2, GLUCOSE, BUN, CREATININE, CALCIUM , MG, PHOS, TSH in the last 8760 hours. Liver Function Tests: No results for input(s): AST, ALT, ALKPHOS, BILITOT, PROT, ALBUMIN in the last 8760 hours. No results for input(s): LIPASE, AMYLASE in the last 8760 hours. No results for input(s): AMMONIA in the last 8760 hours. CBC: No results for input(s): WBC, NEUTROABS, HGB, HCT, MCV, PLT in the last 8760 hours. Lipid Panel: No results for input(s): CHOL, HDL, LDLCALC, TRIG, CHOLHDL, LDLDIRECT in the last 8760 hours. No results found for: HGBA1C  Procedures since last visit: No results found. Results          Assessment/Plan     Cognitive impairment She experiences difficulty with memory and cognitive tasks, such as recalling numbers and completing cognitive tests. These changes are acknowledged as part of the aging process,  emphasizing the importance of safety and support.  Low mood / depression She previously tried sertraline  without effect and does not recall taking it. She declined further treatment, attributing mood changes to life circumstances and preferring to manage without medication.  Vaginal atrophy She was prescribed a steroid cream, initially not recalling its purpose. It was identified as treatment for vaginal atrophy, previously used without significant benefit. She is currently using another vaginal treatment that she finds effective.  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 She expressed dissatisfaction with previous ophthalmology consultation, feeling her concerns were not adequately addressed. - Schedule follow-up ophthalmology appointment to assess progression of visual disturbances.        Labs/tests ordered:  * No order type specified * Next appt:  04/06/2024

## 2024-03-23 NOTE — Patient Instructions (Signed)
 VISIT SUMMARY:  During your visit, we discussed your medication management, vision changes, and other health concerns. We reviewed your current treatments and addressed your concerns about memory changes, mood, and vision. We also discussed your social situation and the support you have at home.  YOUR PLAN:  -COGNITIVE IMPAIRMENT: You are experiencing some difficulty with memory and cognitive tasks, which can be a normal part of aging. It's important to focus on safety and ensure you have the support you need.  -LOW MOOD / DEPRESSION: You have been feeling low, but you prefer to manage your mood without medication. This is a personal choice, and we respect your decision.  -VAGINAL ATROPHY: Vaginal atrophy is a condition where the vaginal walls become thin and dry, often causing discomfort. You are currently using a treatment that you find effective.  -LEGAL BLINDNESS SECONDARY TO HISTORY OF RETINAL DETACHMENT, LEFT EYE: You have legal blindness in your left eye due to a past retinal detachment, and you are experiencing visual disturbances. We will schedule a follow-up appointment with an ophthalmologist to assess the progression of these issues.  INSTRUCTIONS:  Please schedule a follow-up appointment with an ophthalmologist to assess your vision changes. Continue using your current treatments as discussed. If you have any new concerns or changes in your condition, please contact our office.

## 2024-04-05 LAB — CBC WITH DIFFERENTIAL/PLATELET
Absolute Lymphocytes: 1623 {cells}/uL (ref 850–3900)
Absolute Monocytes: 661 {cells}/uL (ref 200–950)
Basophils Absolute: 18 {cells}/uL (ref 0–200)
Basophils Relative: 0.3 %
Eosinophils Absolute: 112 {cells}/uL (ref 15–500)
Eosinophils Relative: 1.9 %
HCT: 44.8 % (ref 35.0–45.0)
Hemoglobin: 14.4 g/dL (ref 11.7–15.5)
MCH: 32.1 pg (ref 27.0–33.0)
MCHC: 32.1 g/dL (ref 32.0–36.0)
MCV: 99.8 fL (ref 80.0–100.0)
MPV: 11 fL (ref 7.5–12.5)
Monocytes Relative: 11.2 %
Neutro Abs: 3487 {cells}/uL (ref 1500–7800)
Neutrophils Relative %: 59.1 %
Platelets: 194 Thousand/uL (ref 140–400)
RBC: 4.49 Million/uL (ref 3.80–5.10)
RDW: 12.8 % (ref 11.0–15.0)
Total Lymphocyte: 27.5 %
WBC: 5.9 Thousand/uL (ref 3.8–10.8)

## 2024-04-05 LAB — LIPID PANEL
Cholesterol: 179 mg/dL (ref <200)
HDL: 52 mg/dL (ref >=50)
LDL Cholesterol (Calc): 102 mg/dL — ABNORMAL HIGH
Non-HDL Cholesterol (Calc): 127 mg/dL (ref <130)
Total CHOL/HDL Ratio: 3.4 (calc) (ref <5.0)
Triglycerides: 151 mg/dL — ABNORMAL HIGH (ref <150)

## 2024-04-05 LAB — COMPLETE METABOLIC PANEL WITHOUT GFR
AG Ratio: 1.4 (calc) (ref 1.0–2.5)
ALT: 12 U/L (ref 6–29)
AST: 16 U/L (ref 10–35)
Albumin: 3.9 g/dL (ref 3.6–5.1)
Alkaline phosphatase (APISO): 63 U/L (ref 37–153)
BUN: 17 mg/dL (ref 7–25)
CO2: 30 mmol/L (ref 20–32)
Calcium: 8.8 mg/dL (ref 8.6–10.4)
Chloride: 102 mmol/L (ref 98–110)
Creat: 0.82 mg/dL (ref 0.60–0.95)
Globulin: 2.8 g/dL (ref 1.9–3.7)
Glucose, Bld: 88 mg/dL (ref 65–99)
Potassium: 4 mmol/L (ref 3.5–5.3)
Sodium: 140 mmol/L (ref 135–146)
Total Bilirubin: 0.7 mg/dL (ref 0.2–1.2)
Total Protein: 6.7 g/dL (ref 6.1–8.1)

## 2024-04-05 LAB — VITAMIN D 25 HYDROXY (VIT D DEFICIENCY, FRACTURES): Vit D, 25-Hydroxy: 33 ng/mL (ref 30–100)

## 2024-04-06 ENCOUNTER — Encounter: Admitting: Student

## 2024-04-09 ENCOUNTER — Ambulatory Visit: Payer: Self-pay | Admitting: Student

## 2024-04-12 MED ORDER — ATORVASTATIN CALCIUM 40 MG PO TABS: 40.0000 mg | ORAL_TABLET | Freq: Every day | ORAL | Status: DC

## 2024-05-25 ENCOUNTER — Non-Acute Institutional Stay: Admitting: Orthopedic Surgery

## 2024-05-25 ENCOUNTER — Encounter: Payer: Self-pay | Admitting: Orthopedic Surgery

## 2024-05-25 VITALS — BP 139/86 | HR 82 | Temp 97.7°F | Ht <= 58 in | Wt 167.2 lb

## 2024-05-25 DIAGNOSIS — F32A Depression, unspecified: Secondary | ICD-10-CM | POA: Diagnosis not present

## 2024-05-25 DIAGNOSIS — G3184 Mild cognitive impairment, so stated: Secondary | ICD-10-CM | POA: Diagnosis not present

## 2024-05-25 DIAGNOSIS — H548 Legal blindness, as defined in USA: Secondary | ICD-10-CM | POA: Diagnosis not present

## 2024-05-25 NOTE — Progress Notes (Signed)
 Location:  Other Nursing Home Room Number: [facility]. Clinic Place of Service:  Clinic (12) Provider:  Greig FORBES Cluster, NP   Cluster Greig FORBES, NP  Patient Care Team: Cluster Greig FORBES, NP as PCP - General (Adult Health Nurse Practitioner)  Extended Emergency Contact Information Primary Emergency Contact: [name], [place] Home Phone: [phone] Mobile Phone: [phone] Relation: Daughter  Code Status:  Full code Goals of care: Advanced Directive information    03/23/2024    9:07 AM  Advanced Directives  Does Patient Have a Medical Advance Directive? No  Would patient like information on creating a medical advance directive? No - Patient declined     Chief Complaint  Patient presents with   Paperwork     DMV Paperwork to be filled out.     HPI:  Pt is a 89 y.o. female seen today for acute visit due to poor vision.   Discussed the use of AI scribe software for clinical note transcription with the patient, who gave verbal consent to proceed.  History of Present Illness   Yolanda Wade is a 89 year old female who presents with concerns regarding driving restrictions at [facility].  She is facing driving restrictions at [facility] after failing a driving assessment test, which she attributes to not wearing her glasses and being unaware of the test's significance. Initially informed she could retake the test, she was later told she could not.  She recently had her eyes rechecked and is getting new lenses. She reports that her eye doctor said it was okay for her to drive. She has been driving on campus without any accidents and feels the restrictions are unwarranted.  She underwent a memory test and believes she remains high functioning and capable of managing her affairs independently. She recalls some words from the test and mentions having assistance with bills and home care.  She is frustrated with the process and is concerned that  even if she passes all tests, she might still face restrictions due to private property rules at [facility]. She is also worried about the paperwork involved and the stress it causes.   She is very hard of hearing during encounter. She did wear wear hearing aids today.     Recent MOCA 19/22 05/14/2024.    Past Medical History:  Diagnosis Date   Basal cell carcinoma 09/22/2021   L neck, [facility]   Chicken pox    Hyperlipidemia    Hypertension    Hypothyroidism    Osteopenia    Thyroid  cancer (HCC) 06/2017   per patient   Past Surgical History:  Procedure Laterality Date   CARPAL TUNNEL RELEASE Right    CATARACT EXTRACTION Bilateral    DILATION AND CURETTAGE OF UTERUS     for post meopausal bleeding   EYE SURGERY     HALLUX VALGUS AUSTIN     IR KYPHO LUMBAR INC FX REDUCE BONE BX UNI/BIL CANNULATION INC/IMAGING  08/03/2022   IR RADIOLOGIST EVAL & MGMT  07/06/2022   IR RADIOLOGIST EVAL & MGMT  08/03/2022   IR RADIOLOGIST EVAL & MGMT  08/19/2022   KNEE CARTILAGE SURGERY Right    UMBILICAL HERNIA REPAIR N/A 05/23/2015   Procedure: HERNIA REPAIR UMBILICAL ADULT;  Surgeon: Larinda Unknown Sharps, MD;  Location: [facility] ORS;  Service: General;  Laterality: N/A;    No Known Allergies  Outpatient Encounter Medications as of 05/25/2024  Medication Sig  atorvastatin  (LIPITOR) 40 MG tablet Take 1 tablet (40 mg total) by mouth daily.   clobetasol  cream (TEMOVATE ) 0.05 % Apply 1 Application topically 2 (two) times daily for 7 days then 1 Application 2 (two) times a week.   estradiol  (ESTRACE  VAGINAL) 0.1 MG/GM vaginal cream Place 1 Applicatorful vaginally 3 (three) times a week.   hydrocortisone  2.5 % cream Apply 1 Application topically 2 (two) times daily.   levothyroxine  (SYNTHROID ) 125 MCG tablet Take 1 tablet (125 mcg total) by mouth daily before breakfast.   lisinopril -hydrochlorothiazide  (ZESTORETIC ) 10-12.5 MG tablet Take 1 tablet by mouth daily.   Multiple Vitamins-Minerals (PRESERVISION AREDS  PO) Take 1 tablet by mouth 2 (two) times daily.   sertraline  (ZOLOFT ) 50 MG tablet Take 0.5 tablets (25 mg total) by mouth daily for 7 days, THEN 1 tablet (50 mg total) daily for 7 days, THEN 2 tablets (100 mg total) daily for 14 days.   No facility-administered encounter medications on file as of 05/25/2024.    Review of Systems  Constitutional: Negative.   HENT:  Positive for hearing loss.   Respiratory: Negative.    Cardiovascular: Negative.   Gastrointestinal: Negative.   Genitourinary: Negative.   Musculoskeletal:  Positive for arthralgias.  Neurological: Negative.   Psychiatric/Behavioral:  Positive for dysphoric mood. Negative for confusion. The patient is not nervous/anxious.     Immunization History  Administered Date(s) Administered   PFIZER Comirnaty(Gray Top)Covid-19 Tri-Sucrose Vaccine 08/10/2019, 09/04/2019, 05/31/2020   Pneumococcal Polysaccharide-23 03/26/2005   Tdap 04/27/2011, 09/20/2021   Unspecified SARS-COV-2 Vaccination 03/31/2023, 04/28/2023, 11/04/2023   Zoster, Live 08/03/2011   Pertinent  Health Maintenance Due  Topic Date Due   DEXA SCAN  Never done   Influenza Vaccine  Never done      09/20/2021    1:48 PM 12/30/2023    9:22 AM 03/23/2024    9:06 AM 05/25/2024    9:14 AM  Fall Risk  Falls in the past year?  1 0 0  Was there an injury with Fall?  1 0 0  Fall Risk Category Calculator  3 0 0  (RETIRED) Patient Fall Risk Level Moderate fall risk      Patient at Risk for Falls Due to  Impaired balance/gait Impaired balance/gait Impaired balance/gait  Fall risk Follow up  Falls evaluation completed Falls evaluation completed Falls evaluation completed     Data saved with a previous flowsheet row definition   Functional Status Survey:    Vitals:   05/25/24 0912  BP: 139/86  Pulse: 82  Temp: 97.7 F (36.5 C)  SpO2: 96%  Weight: 167 lb 3.2 oz (75.8 kg)  Height: 4' 10 (1.473 m)   Body mass index is 34.94 kg/m. Physical Exam Vitals  reviewed.  Constitutional:      General: She is not in acute distress. HENT:     Head: Normocephalic.     Ears:     Comments: HOH Eyes:     General:        Right eye: No discharge.        Left eye: No discharge.  Cardiovascular:     Rate and Rhythm: Normal rate and regular rhythm.     Pulses: Normal pulses.     Heart sounds: Normal heart sounds.  Pulmonary:     Effort: Pulmonary effort is normal.     Breath sounds: Normal breath sounds.  Abdominal:     General: Bowel sounds are normal.     Palpations: Abdomen is soft.  Musculoskeletal:        General: Normal range of motion.     Cervical back: Neck supple.  Skin:    General: Skin is warm.     Capillary Refill: Capillary refill takes less than 2 seconds.  Neurological:     General: No focal deficit present.     Mental Status: She is alert and oriented to person, place, and time.  Psychiatric:        Mood and Affect: Mood normal.     Labs reviewed: Recent Labs    04/05/24 0759  NA 140  K 4.0  CL 102  CO2 30  GLUCOSE 88  BUN 17  CREATININE 0.82  CALCIUM  8.8   Recent Labs    04/05/24 0759  AST 16  ALT 12  BILITOT 0.7  PROT 6.7   Recent Labs    04/05/24 0759  WBC 5.9  NEUTROABS 3,487  HGB 14.4  HCT 44.8  MCV 99.8  PLT 194   Lab Results  Component Value Date   TSH 2.13 05/02/2009   No results found for: HGBA1C Lab Results  Component Value Date   CHOL 179 04/05/2024   HDL 52 04/05/2024   LDLCALC 102 (H) 04/05/2024   LDLDIRECT 155.2 05/02/2009   TRIG 151 (H) 04/05/2024   CHOLHDL 3.4 04/05/2024    Significant Diagnostic Results in last 30 days:  No results found.  Assessment/Plan 1. Legal blindness (Primary) - followed by [facility] - h/o macular degeneration - recently seen> new glasses coming  - will have provider fill out vision portion of DMV paperwork - OT evaluation completed per social work  - only want to drive short distances on campus> no h/o accidents  - medically  cleared to drive   2. Mild cognitive impairment - recent [facility] 19/22 05/14/2024 - good historian today - no behaviors - lives in [place]> performs ADLs on own  3. Depression, unspecified depression type - no worsening mood - Na+ stable  - cont Zoloft     Family/ staff Communication: plan discussed with patient and nurse  Labs/tests ordered:  none

## 2024-05-25 NOTE — Patient Instructions (Addendum)
 Please get flu vaccine at local pharmacy   I will have DMV paperwork done by 05/30/2024

## 2024-06-20 ENCOUNTER — Telehealth: Payer: Self-pay

## 2024-06-20 NOTE — Telephone Encounter (Signed)
 It looks like she already had an OT evaluation per Amy's last note.  Can we call the patient to clarify this  OT evaluation completed per social work   Also if she wants OT evaluation for something else please clarify that so the correct diagnosis can be placed.

## 2024-06-20 NOTE — Telephone Encounter (Signed)
 Copied from CRM [id number]. Topic: General - Other >> Jun 20, 2024  3:07 PM Chiquita SQUIBB wrote: Reason for CRM: Jaci a nurse with [facility] is calling in stating that Jahari would like to have the patient get orders to receive OT for low vision and trouble with daily living, also phone activities  as well. Please advise Havannah the nurse. Fax number too [facility] is [phone].

## 2024-06-20 NOTE — Telephone Encounter (Signed)
 Detailed voicemail left for patient with providers response.   Waiting for response/return call from patient.

## 2024-06-20 NOTE — Telephone Encounter (Signed)
 Copied from CRM [id number]. Topic: General - Other >> Jun 20, 2024  3:07 PM Chiquita SQUIBB wrote: Reason for CRM: Jaci a nurse with [facility] is calling in stating that Pecolia would like to have the patient get orders to receive OT for low vision and trouble with daily living, also phone activities  as well. Please advise Kolby the nurse. Fax number too [facility] is [phone]. >> Jun 20, 2024  4:10 PM Graeme ORN wrote: Patient returned missed call. States she did have someone come out and help her with a lot of things. She needs her to come back and help her set up her phone due to her eye sight. OT told her she needed referral/order from provider for her to come back out to assist with phone. She said he name was Rosina. Thank You

## 2024-06-21 ENCOUNTER — Other Ambulatory Visit: Payer: Self-pay | Admitting: Internal Medicine

## 2024-06-21 DIAGNOSIS — H3530 Unspecified macular degeneration: Secondary | ICD-10-CM

## 2024-06-25 NOTE — Telephone Encounter (Signed)
 Patient requesting OT Order placed.  Forwarded to [place] due to Amy out of office.

## 2024-06-25 NOTE — Telephone Encounter (Signed)
 Question if this is OT vs ST, Talked to the therapy department at [facility] and they will be in touch with her with the appropriate department for care needs.

## 2024-06-25 NOTE — Telephone Encounter (Signed)
 Copied from CRM #[id number]. Topic: Referral - Question >> Jun 25, 2024  8:46 AM Diannia H wrote: Reason for CRM: Patient called and stated she did have someone come out and help her with a lot of things. She stated that she was told that when she gets her new phone to give us  a call and someone would help her. OT told her she needed referral/order from provider for her to come back out to assist with phone and to help her so she can see her phone better. She said her name was Rosina. Could you assist? Patients callback number is [phone]. She is wanting a callback about the status. She is needing a new referral put in so she can get this resolved.

## 2024-07-23 ENCOUNTER — Non-Acute Institutional Stay: Admitting: Orthopedic Surgery

## 2024-07-23 ENCOUNTER — Encounter: Payer: Self-pay | Admitting: Orthopedic Surgery

## 2024-07-23 VITALS — BP 138/88 | HR 78 | Temp 97.6°F | Ht <= 58 in | Wt 167.6 lb

## 2024-07-23 DIAGNOSIS — M25562 Pain in left knee: Secondary | ICD-10-CM | POA: Diagnosis not present

## 2024-07-23 DIAGNOSIS — M545 Low back pain, unspecified: Secondary | ICD-10-CM

## 2024-07-23 DIAGNOSIS — M25561 Pain in right knee: Secondary | ICD-10-CM

## 2024-07-23 DIAGNOSIS — G8929 Other chronic pain: Secondary | ICD-10-CM

## 2024-07-23 NOTE — Progress Notes (Signed)
 " Location:  Other Nursing Home Room Number: [facility]. Place of Service:  Clinic (12) Provider:  Greig FORBES Cluster, NP   Cluster Greig FORBES, NP  Patient Care Team: Cluster Greig FORBES, NP as PCP - General (Adult Health Nurse Practitioner)  Extended Emergency Contact Information Primary Emergency Contact: [name], [place] Home Phone: [phone] Mobile Phone: [phone] Relation: Daughter  Code Status:  Full code Goals of care: Advanced Directive information    03/23/2024    9:07 AM  Advanced Directives  Does Patient Have a Medical Advance Directive? No  Would patient like information on creating a medical advance directive? No - Patient declined     Chief Complaint  Patient presents with   Back Pain    Complains of Back Pain. Requesting PT    HPI:  Pt is a 89 y.o. female seen today for acute visit due to chronic back pain.    Discussed the use of AI scribe software for clinical note transcription with the patient, who gave verbal consent to proceed.  History of Present Illness   Yolanda Wade is a 89 year old female with arthritis who presents with back pain.  H/o L3 and L4 compression fracture.She experiences intermittent back pain that radiates across her back, which has subsided on its own without any medication. She had increased pain 12/26, but reports it resolved 12/28. She does not take any over the counter medication for pain. Requesting to restart PT to help with back pain.   She has ongoing knee issues due to arthritis, with occasional pain when walking. She did not pursue knee replacement during her younger years. She recently saw commercial for knee injections. She reports intermittent knee pain with prolonged walking. Denies daily pain. No recent injury or fall.              Past Medical History:  Diagnosis Date   Basal cell carcinoma 09/22/2021   L neck, [facility]   Chicken pox    Hyperlipidemia    Hypertension     Hypothyroidism    Osteopenia    Thyroid  cancer (HCC) 06/2017   per patient   Past Surgical History:  Procedure Laterality Date   CARPAL TUNNEL RELEASE Right    CATARACT EXTRACTION Bilateral    DILATION AND CURETTAGE OF UTERUS     for post meopausal bleeding   EYE SURGERY     HALLUX VALGUS AUSTIN     IR KYPHO LUMBAR INC FX REDUCE BONE BX UNI/BIL CANNULATION INC/IMAGING  08/03/2022   IR RADIOLOGIST EVAL & MGMT  07/06/2022   IR RADIOLOGIST EVAL & MGMT  08/03/2022   IR RADIOLOGIST EVAL & MGMT  08/19/2022   KNEE CARTILAGE SURGERY Right    UMBILICAL HERNIA REPAIR N/A 05/23/2015   Procedure: HERNIA REPAIR UMBILICAL ADULT;  Surgeon: Larinda Unknown Sharps, MD;  Location: [facility] ORS;  Service: General;  Laterality: N/A;    Allergies[1]  Outpatient Encounter Medications as of 07/23/2024  Medication Sig   atorvastatin  (LIPITOR) 40 MG tablet Take 1 tablet (40 mg total) by mouth daily.   clobetasol  cream (TEMOVATE ) 0.05 % Apply 1 Application topically 2 (two) times daily for 7 days then 1 Application 2 (two) times a week.   estradiol  (ESTRACE  VAGINAL) 0.1 MG/GM vaginal cream Place 1 Applicatorful vaginally 3 (three) times a week.   hydrocortisone  2.5 % cream Apply 1 Application topically 2 (two) times daily.   levothyroxine  (  SYNTHROID ) 125 MCG tablet Take 1 tablet (125 mcg total) by mouth daily before breakfast.   lisinopril -hydrochlorothiazide  (ZESTORETIC ) 10-12.5 MG tablet Take 1 tablet by mouth daily.   Multiple Vitamins-Minerals (PRESERVISION AREDS PO) Take 1 tablet by mouth 2 (two) times daily.   sertraline  (ZOLOFT ) 50 MG tablet Take 0.5 tablets (25 mg total) by mouth daily for 7 days, THEN 1 tablet (50 mg total) daily for 7 days, THEN 2 tablets (100 mg total) daily for 14 days.   No facility-administered encounter medications on file as of 07/23/2024.    Review of Systems  Constitutional:  Negative for fatigue and fever.  HENT:  Negative for sore throat and trouble swallowing.   Eyes:   Negative for visual disturbance.  Respiratory:  Negative for cough and shortness of breath.   Cardiovascular:  Negative for chest pain and leg swelling.  Musculoskeletal:  Positive for arthralgias, back pain and gait problem.  Psychiatric/Behavioral:  Negative for dysphoric mood. The patient is not nervous/anxious.     Immunization History  Administered Date(s) Administered   PFIZER Comirnaty(Gray Top)Covid-19 Tri-Sucrose Vaccine 08/10/2019, 09/04/2019, 05/31/2020   Pneumococcal Polysaccharide-23 03/26/2005   Tdap 04/27/2011, 09/20/2021   Unspecified SARS-COV-2 Vaccination 03/31/2023, 04/28/2023, 11/04/2023   Zoster, Live 08/03/2011   Pertinent  Health Maintenance Due  Topic Date Due   Bone Density Scan  Never done   Influenza Vaccine  Never done      12/30/2023    9:22 AM 03/23/2024    9:06 AM 05/25/2024    9:14 AM 05/27/2024   10:49 AM 07/23/2024    1:55 PM  Fall Risk  Falls in the past year? 1 0 0 0 0  Was there an injury with Fall? 1  0  0  0  0  Fall Risk Category Calculator 3 0 0 0 0  Patient at Risk for Falls Due to Impaired balance/gait Impaired balance/gait Impaired balance/gait Impaired balance/gait Impaired vision  Fall risk Follow up Falls evaluation completed Falls evaluation completed Falls evaluation completed Falls evaluation completed Falls evaluation completed     Data saved with a previous flowsheet row definition   Functional Status Survey:    Vitals:   07/23/24 1354  BP: 138/88  Pulse: 78  Temp: 97.6 F (36.4 C)  SpO2: 96%  Weight: 167 lb 9.6 oz (76 kg)  Height: 4' 10 (1.473 m)   Body mass index is 35.03 kg/m. Physical Exam Vitals reviewed.  Constitutional:      General: She is not in acute distress. Eyes:     General:        Right eye: No discharge.        Left eye: No discharge.  Cardiovascular:     Rate and Rhythm: Normal rate and regular rhythm.     Pulses: Normal pulses.     Heart sounds: Normal heart sounds.  Pulmonary:      Effort: Pulmonary effort is normal.     Breath sounds: Normal breath sounds.  Abdominal:     Palpations: Abdomen is soft.  Musculoskeletal:     Cervical back: Normal and neck supple.     Thoracic back: Normal.     Lumbar back: Normal.     Right knee: No swelling or deformity. Normal range of motion. No tenderness.     Left knee: No swelling or deformity. Normal range of motion. No tenderness.     Right lower leg: No edema.     Left lower leg: No edema.  Skin:  General: Skin is warm.     Capillary Refill: Capillary refill takes less than 2 seconds.  Neurological:     General: No focal deficit present.     Mental Status: She is alert and oriented to person, place, and time.     Gait: Gait abnormal.  Psychiatric:        Mood and Affect: Mood normal.     Labs reviewed: Recent Labs    04/05/24 0759  NA 140  K 4.0  CL 102  CO2 30  GLUCOSE 88  BUN 17  CREATININE 0.82  CALCIUM  8.8   Recent Labs    04/05/24 0759  AST 16  ALT 12  BILITOT 0.7  PROT 6.7   Recent Labs    04/05/24 0759  WBC 5.9  NEUTROABS 3,487  HGB 14.4  HCT 44.8  MCV 99.8  PLT 194   Lab Results  Component Value Date   TSH 2.13 05/02/2009   No results found for: HGBA1C Lab Results  Component Value Date   CHOL 179 04/05/2024   HDL 52 04/05/2024   LDLCALC 102 (H) 04/05/2024   LDLDIRECT 155.2 05/02/2009   TRIG 151 (H) 04/05/2024   CHOLHDL 3.4 04/05/2024    Significant Diagnostic Results in last 30 days:  No results found.  Assessment/Plan 1. Chronic bilateral low back pain without sciatica (Primary) - ongoing - h/o L3/4 compression fracture - increased pain 12/26> resolved at this time - exam unremarkable, no radiation or loss of B&B - discussed tylenol  prn for pain  - recommend light walking  - Ambulatory referral to Physical Therapy  2. Chronic pain of both knees - ongoing - no recent fall or injury - not interested in ortho referral - exam unremarkable  - see above -  discussed voltaren gel pen for pain     Family/ staff Communication: plan discussed with patient   Labs/tests ordered: PT referral        [1] No Known Allergies  "

## 2024-07-23 NOTE — Patient Instructions (Addendum)
 Recommend tylenol  1000 mg every morning is the pain starts to bother you daily  Consider Voltaren gel> you may purchase at local pharmacy> apply to both knees 1-3 times daily  Referral sent to [facility] therapy department to restart PT

## 2024-07-23 NOTE — Progress Notes (Signed)
 SABRA

## 2024-07-27 ENCOUNTER — Non-Acute Institutional Stay: Payer: Self-pay | Admitting: Orthopedic Surgery

## 2024-07-27 ENCOUNTER — Encounter: Payer: Self-pay | Admitting: Orthopedic Surgery

## 2024-07-27 VITALS — BP 138/76 | HR 81 | Temp 97.8°F | Ht <= 58 in | Wt 167.0 lb

## 2024-07-27 DIAGNOSIS — I10 Essential (primary) hypertension: Secondary | ICD-10-CM

## 2024-07-27 DIAGNOSIS — F339 Major depressive disorder, recurrent, unspecified: Secondary | ICD-10-CM

## 2024-07-27 DIAGNOSIS — E66811 Obesity, class 1: Secondary | ICD-10-CM | POA: Diagnosis not present

## 2024-07-27 DIAGNOSIS — E890 Postprocedural hypothyroidism: Secondary | ICD-10-CM | POA: Diagnosis not present

## 2024-07-27 DIAGNOSIS — N1831 Chronic kidney disease, stage 3a: Secondary | ICD-10-CM

## 2024-07-27 DIAGNOSIS — E782 Mixed hyperlipidemia: Secondary | ICD-10-CM | POA: Diagnosis not present

## 2024-07-27 DIAGNOSIS — L900 Lichen sclerosus et atrophicus: Secondary | ICD-10-CM

## 2024-07-27 MED ORDER — ATORVASTATIN CALCIUM 40 MG PO TABS: 40.0000 mg | ORAL_TABLET | Freq: Every day | ORAL | 1 refills | Status: AC

## 2024-07-27 MED ORDER — CLOBETASOL PROPIONATE 0.05 % EX CREA: TOPICAL_CREAM | CUTANEOUS | 3 refills | Status: AC

## 2024-07-27 MED ORDER — LISINOPRIL-HYDROCHLOROTHIAZIDE 10-12.5 MG PO TABS: 1.0000 | ORAL_TABLET | Freq: Every day | ORAL | 1 refills | Status: AC

## 2024-07-27 MED ORDER — LEVOTHYROXINE SODIUM 125 MCG PO TABS: 125.0000 ug | ORAL_TABLET | Freq: Every day | ORAL | 1 refills | Status: AC

## 2024-07-27 NOTE — Patient Instructions (Addendum)
 You are scheduled for lab work 01/05 @ 07:30 am> the lab lady will come to your apartment   Medications refilled   Please get pneumonia vaccine at local pharmacy> it's called prevnar 20  You can try voltaren gel on knees for pain> you may purchase at local pharmacy

## 2024-07-27 NOTE — Progress Notes (Signed)
 " Location:  Other Nursing Home Room Number: [facility] of Service:  Clinic ([phone]) Provider:    Gil Greig BRAVO, NP  Patient Care Team: Gil Greig BRAVO, NP as PCP - General (Adult Health Nurse Practitioner)  Extended Emergency Contact Information Primary Emergency Contact: [name], [place] Home Phone: [phone] Mobile Phone: [phone] Relation: Daughter  Code Status:  Full code  Goals of care: Advanced Directive information    07/27/2024   10:12 AM  Advanced Directives  Does Patient Have a Medical Advance Directive? Yes  Type of Estate Agent of [place];Living will;Out of facility DNR (pink MOST or yellow form)  Does patient want to make changes to medical advance directive? No - Patient declined  Copy of Healthcare Power of Attorney in Chart? No - copy requested     Chief Complaint  Patient presents with   Medical Management of Chronic Issues    Medical management of Chronic Issues. 4 Month follow up    HPI:  Pt is a 89 y.o. female seen today for medical management of chronic diseases.    Discussed the use of AI scribe software for clinical note transcription with the patient, who gave verbal consent to proceed.  History of Present Illness   Yolanda Wade is a 89 year old female who presents for a routine checkup and prescription refills.  No health concerns today.   Current weight 167 lbs.   BP 138/76. Remains on lisinopril -hydrochlorothiazide .   Remains on levothyroxine  for postoperative hypothyroidism.   LDL 102, remains on atorvastatin .   She has no current health concerns aside from the prescription issue. She eats two meals a day with a snack in between and reports no falls or accidents at home. No orthopedic pain, shortness of breath, or chest pain.  She experiences occasional itching at night, particularly on her ankles, for which she uses a prescribed itch cream. She would  like a renewal of this prescription as it helps her sleep. No issues with bowel movements, although they can be unpredictable at times.      Past Medical History:  Diagnosis Date   Basal cell carcinoma 09/22/2021   L neck, [facility]   Chicken pox    Hyperlipidemia    Hypertension    Hypothyroidism    Osteopenia    Thyroid  cancer (HCC) 06/2017   per patient   Past Surgical History:  Procedure Laterality Date   CARPAL TUNNEL RELEASE Right    CATARACT EXTRACTION Bilateral    DILATION AND CURETTAGE OF UTERUS     for post meopausal bleeding   EYE SURGERY     HALLUX VALGUS AUSTIN     IR KYPHO LUMBAR INC FX REDUCE BONE BX UNI/BIL CANNULATION INC/IMAGING  08/03/2022   IR RADIOLOGIST EVAL & MGMT  07/06/2022   IR RADIOLOGIST EVAL & MGMT  08/03/2022   IR RADIOLOGIST EVAL & MGMT  08/19/2022   KNEE CARTILAGE SURGERY Right    UMBILICAL HERNIA REPAIR N/A 05/23/2015   Procedure: HERNIA REPAIR UMBILICAL ADULT;  Surgeon: Larinda Unknown Sharps, MD;  Location: [facility] ORS;  Service: General;  Laterality: N/A;    Allergies[1]  Outpatient Encounter Medications as of 07/27/2024  Medication Sig   atorvastatin  (LIPITOR) 40 MG tablet Take 1 tablet (40 mg total) by mouth daily.   clobetasol  cream (TEMOVATE ) 0.05 % Apply 1 Application topically 2 (two) times daily for 7 days then 1 Application  2 (two) times a week.   estradiol  (ESTRACE  VAGINAL) 0.1 MG/GM vaginal cream Place 1 Applicatorful vaginally 3 (three) times a week.   hydrocortisone  2.5 % cream Apply 1 Application topically 2 (two) times daily.   levothyroxine  (SYNTHROID ) 125 MCG tablet Take 1 tablet (125 mcg total) by mouth daily before breakfast.   lisinopril -hydrochlorothiazide  (ZESTORETIC ) 10-12.5 MG tablet Take 1 tablet by mouth daily.   Multiple Vitamins-Minerals (PRESERVISION AREDS PO) Take 1 tablet by mouth 2 (two) times daily.   sertraline  (ZOLOFT ) 50 MG tablet Take 0.5 tablets (25 mg total) by mouth daily for 7 days, THEN 1 tablet (50 mg total) daily  for 7 days, THEN 2 tablets (100 mg total) daily for 14 days.   No facility-administered encounter medications on file as of 07/27/2024.    Review of Systems  Constitutional:  Negative for fatigue and fever.  HENT:  Positive for hearing loss. Negative for sore throat and trouble swallowing.   Eyes:  Negative for visual disturbance.  Respiratory:  Negative for cough and shortness of breath.   Cardiovascular:  Negative for chest pain and leg swelling.  Gastrointestinal:  Negative for abdominal distention and abdominal pain.  Genitourinary:  Negative for dysuria and hematuria.  Musculoskeletal:  Positive for gait problem.  Skin:  Negative for wound.  Neurological:  Negative for dizziness, weakness and headaches.  Psychiatric/Behavioral:  Positive for dysphoric mood. Negative for confusion and sleep disturbance. The patient is not nervous/anxious.     Immunization History  Administered Date(s) Administered   Influenza-Unspecified 05/25/2024   PFIZER Comirnaty(Gray Top)Covid-19 Tri-Sucrose Vaccine 08/10/2019, 09/04/2019, 05/31/2020   Pneumococcal Polysaccharide-23 03/26/2005   Tdap 04/27/2011, 09/20/2021   Unspecified SARS-COV-2 Vaccination 03/31/2023, 04/28/2023, 11/04/2023, 05/25/2024   Zoster, Live 08/03/2011   Pertinent  Health Maintenance Due  Topic Date Due   Bone Density Scan  Never done   Influenza Vaccine  Completed      03/23/2024    9:06 AM 05/25/2024    9:14 AM 05/27/2024   10:49 AM 07/23/2024    1:55 PM 07/23/2024    2:27 PM  Fall Risk  Falls in the past year? 0 0 0 0 0  Was there an injury with Fall? 0  0  0  0 0  Fall Risk Category Calculator 0 0 0 0 0  Patient at Risk for Falls Due to Impaired balance/gait Impaired balance/gait Impaired balance/gait Impaired vision History of fall(s);Impaired vision  Fall risk Follow up Falls evaluation completed Falls evaluation completed Falls evaluation completed Falls evaluation completed Falls evaluation completed     Data  saved with a previous flowsheet row definition   Functional Status Survey:    Vitals:   07/27/24 1009  BP: 138/76  Pulse: 81  Temp: 97.8 F (36.6 C)  SpO2: 98%  Weight: 167 lb (75.8 kg)  Height: 4' 10 (1.473 m)   Body mass index is 34.9 kg/m. Physical Exam Vitals reviewed.  Constitutional:      General: She is not in acute distress. HENT:     Head: Normocephalic.     Right Ear: Tympanic membrane normal.     Left Ear: Tympanic membrane normal.     Nose: Nose normal.     Mouth/Throat:     Mouth: Mucous membranes are moist.  Eyes:     General:        Right eye: No discharge.        Left eye: No discharge.  Cardiovascular:     Rate and Rhythm: Normal  rate and regular rhythm.     Pulses: Normal pulses.     Heart sounds: Normal heart sounds.  Pulmonary:     Effort: Pulmonary effort is normal.     Breath sounds: Normal breath sounds.  Abdominal:     General: Bowel sounds are normal. There is no distension.     Palpations: Abdomen is soft.     Tenderness: There is no abdominal tenderness.  Musculoskeletal:     Cervical back: Neck supple.     Right lower leg: No edema.     Left lower leg: No edema.  Skin:    General: Skin is warm.     Capillary Refill: Capillary refill takes less than 2 seconds.  Neurological:     General: No focal deficit present.     Mental Status: She is alert and oriented to person, place, and time.     Gait: Gait normal.  Psychiatric:        Mood and Affect: Mood normal.     Labs reviewed: Recent Labs    04/05/24 0759  NA 140  K 4.0  CL 102  CO2 30  GLUCOSE 88  BUN 17  CREATININE 0.82  CALCIUM  8.8   Recent Labs    04/05/24 0759  AST 16  ALT 12  BILITOT 0.7  PROT 6.7   Recent Labs    04/05/24 0759  WBC 5.9  NEUTROABS 3,487  HGB 14.4  HCT 44.8  MCV 99.8  PLT 194   Lab Results  Component Value Date   TSH 2.13 05/02/2009   No results found for: HGBA1C Lab Results  Component Value Date   CHOL 179 04/05/2024    HDL 52 04/05/2024   LDLCALC 102 (H) 04/05/2024   LDLDIRECT 155.2 05/02/2009   TRIG 151 (H) 04/05/2024   CHOLHDL 3.4 04/05/2024    Significant Diagnostic Results in last 30 days:  No results found.  Assessment/Plan 1. Depression, recurrent (Primary) - no changes in mood - Na+ stable - cont Zoloft   2. Postoperative hypothyroidism - TSH 0.762 11/2023 - cont levothyroxine  - recheck TSH  - levothyroxine  (SYNTHROID ) 125 MCG tablet; Take 1 tablet (125 mcg total) by mouth daily before breakfast.  Dispense: 90 tablet; Refill: 1 - TSH  3. Stage 3a chronic kidney disease (HCC) - GFR 68 > was 59  - encourage hydration with water - avoid NSAIDS  4. Mixed hyperlipidemia - LDL 102  - cont diet low in fat, processed and fried foods - atorvastatin  (LIPITOR) 40 MG tablet; Take 1 tablet (40 mg total) by mouth daily.  Dispense: 90 tablet; Refill: 1  5. Essential hypertension - controlled with lisinopril -HCTZ - lisinopril -hydrochlorothiazide  (ZESTORETIC ) 10-12.5 MG tablet; Take 1 tablet by mouth daily.  Dispense: 90 tablet; Refill: 1 - CBC with Differential/Platelet - Complete Metabolic Panel with eGFR  6. Obesity (BMI 30.0-34.9) - BMI 34.90 - sedentary lifestyle due to advanced age  - no recommendations   8. Lichen sclerosus - clobetasol  cream (TEMOVATE ) 0.05 %; Apply 1 Application topically 2 (two) times daily for 7 days then 1 Application 2 (two) times a week.  Dispense: 34 g; Refill: 3    Family/ staff Communication: plan discussed wit patient   Labs/tests ordered: cbc/diff, cmp, TSH      [1] No Known Allergies  "

## 2024-07-30 ENCOUNTER — Telehealth: Payer: Self-pay | Admitting: *Deleted

## 2024-07-30 NOTE — Telephone Encounter (Signed)
 Copied from CRM #[id number]. Topic: General - Other >> Jul 30, 2024  7:45 AM Diannia H wrote: Reason for CRM: Patient called and stated that no one is at [facility] to get her blood work and she is going back to bed because there is no one there.

## 2024-07-31 NOTE — Telephone Encounter (Signed)
 Patient Rescheduled to have labs drawn at home on 08/02/24

## 2024-07-31 NOTE — Telephone Encounter (Signed)
 Gil Greig BRAVO, NP to Me (Selected Message)     07/30/24  9:14 AM Can we reschedule when you are back on campus.

## 2024-08-13 ENCOUNTER — Encounter: Payer: Self-pay | Admitting: Orthopedic Surgery

## 2024-08-13 ENCOUNTER — Non-Acute Institutional Stay: Admitting: Orthopedic Surgery

## 2024-08-13 VITALS — BP 138/88 | HR 79 | Temp 97.9°F | Ht <= 58 in | Wt 167.0 lb

## 2024-08-13 DIAGNOSIS — W19XXXA Unspecified fall, initial encounter: Secondary | ICD-10-CM | POA: Diagnosis not present

## 2024-08-13 DIAGNOSIS — L03116 Cellulitis of left lower limb: Secondary | ICD-10-CM

## 2024-08-13 MED ORDER — DOXYCYCLINE HYCLATE 100 MG PO TABS: 100.0000 mg | ORAL_TABLET | Freq: Two times a day (BID) | ORAL | 0 refills | Status: AC

## 2024-08-13 MED ORDER — SACCHAROMYCES BOULARDII 250 MG PO CAPS: 250.0000 mg | ORAL_CAPSULE | Freq: Two times a day (BID) | ORAL | 0 refills | Status: AC

## 2024-08-13 NOTE — Patient Instructions (Signed)
 See Janci twice weekly x 2 weeks to make sure wound is healing up  Antibiotic sent to pharmacy to promote wound healing ( called doxycycline )  I also sent probiotic to help with diarrhea side effect associated with antibiotics (Florastor)

## 2024-08-13 NOTE — Progress Notes (Signed)
 " Location:  Other Nursing Home Room Number: [facility] of Service:  Clinic [phone]) Provider:  Greig FORBES Cluster, NP   Cluster Greig FORBES, NP  Patient Care Team: Cluster Greig FORBES, NP as PCP - General (Adult Health Nurse Practitioner)  Extended Emergency Contact Information Primary Emergency Contact: [name], [place] Home Phone: [phone] Mobile Phone: [phone] Relation: Daughter  Code Status:  DNR Goals of care: Advanced Directive information    07/27/2024   10:12 AM  Advanced Directives  Does Patient Have a Medical Advance Directive? Yes  Type of Estate Agent of [place];Living will;Out of facility DNR (pink MOST or yellow form)  Does patient want to make changes to medical advance directive? No - Patient declined  Copy of Healthcare Power of Attorney in Chart? No - copy requested     Chief Complaint  Patient presents with   Leg Wound    Left Lower Leg Wound. Painful and stings when she first gets up. Slipped out of bed on Thursday and had skin tear.     HPI:  Pt is a 89 y.o. female seen today for acute visit due to left lower leg wound.   Discussed the use of AI scribe software for clinical note transcription with the patient, who gave verbal consent to proceed.  History of Present Illness   Yolanda Wade is a 89 year old female who presents with a leg injury sustained from a fall.  She injured her leg on Thursday after bumping into her bed while getting up, resulting in a fall where her leg did not land smoothly. She was evaluated by EMS and wound was wrapped. She saw [place] nurse earlier today and recommended provider visit due to increased swelling and tenderness.   She c/o tenderness surrounding wound. The pain is making it difficult for her to get up from sitting position. She notes that the leg feels better without the big bandage, although it still hurts when touched.  She does not have any  medication allergies. Afebrile. Vitals stable.        Past Medical History:  Diagnosis Date   Basal cell carcinoma 09/22/2021   L neck, [facility]   Chicken pox    Hyperlipidemia    Hypertension    Hypothyroidism    Osteopenia    Thyroid  cancer (HCC) 06/2017   per patient   Past Surgical History:  Procedure Laterality Date   CARPAL TUNNEL RELEASE Right    CATARACT EXTRACTION Bilateral    DILATION AND CURETTAGE OF UTERUS     for post meopausal bleeding   EYE SURGERY     HALLUX VALGUS AUSTIN     IR KYPHO LUMBAR INC FX REDUCE BONE BX UNI/BIL CANNULATION INC/IMAGING  08/03/2022   IR RADIOLOGIST EVAL & MGMT  07/06/2022   IR RADIOLOGIST EVAL & MGMT  08/03/2022   IR RADIOLOGIST EVAL & MGMT  08/19/2022   KNEE CARTILAGE SURGERY Right    UMBILICAL HERNIA REPAIR N/A 05/23/2015   Procedure: HERNIA REPAIR UMBILICAL ADULT;  Surgeon: Larinda Unknown Sharps, MD;  Location: [facility] ORS;  Service: General;  Laterality: N/A;    Allergies[1]  Outpatient Encounter Medications as of 08/13/2024  Medication Sig   atorvastatin  (LIPITOR) 40 MG tablet Take 1 tablet (40 mg total) by mouth daily.   clobetasol  cream (TEMOVATE ) 0.05 % Apply 1 Application topically 2 (two) times daily for 7 days then 1 Application  2 (two) times a week.   doxycycline  (VIBRA -TABS) 100 MG tablet Take 1 tablet (100 mg total) by mouth 2 (two) times daily for 10 days.   estradiol  (ESTRACE  VAGINAL) 0.1 MG/GM vaginal cream Place 1 Applicatorful vaginally 3 (three) times a week.   hydrocortisone  2.5 % cream Apply 1 Application topically 2 (two) times daily.   levothyroxine  (SYNTHROID ) 125 MCG tablet Take 1 tablet (125 mcg total) by mouth daily before breakfast.   lisinopril -hydrochlorothiazide  (ZESTORETIC ) 10-12.5 MG tablet Take 1 tablet by mouth daily.   Multiple Vitamins-Minerals (PRESERVISION AREDS PO) Take 1 tablet by mouth 2 (two) times daily.   saccharomyces boulardii (FLORASTOR) 250 MG capsule Take 1 capsule (250 mg total) by mouth 2  (two) times daily for 10 days.   sertraline  (ZOLOFT ) 50 MG tablet Take 0.5 tablets (25 mg total) by mouth daily for 7 days, THEN 1 tablet (50 mg total) daily for 7 days, THEN 2 tablets (100 mg total) daily for 14 days.   No facility-administered encounter medications on file as of 08/13/2024.    Review of Systems  Constitutional: Negative.   HENT: Negative.    Respiratory:  Negative for cough and shortness of breath.   Cardiovascular:  Negative for chest pain and leg swelling.  Gastrointestinal: Negative.   Genitourinary: Negative.   Musculoskeletal: Negative.   Skin:  Positive for wound.  Neurological:  Negative for dizziness and light-headedness.  Psychiatric/Behavioral: Negative.      Immunization History  Administered Date(s) Administered   Influenza-Unspecified 05/25/2024   PFIZER Comirnaty(Gray Top)Covid-19 Tri-Sucrose Vaccine 08/10/2019, 09/04/2019, 05/31/2020   Pneumococcal Polysaccharide-23 03/26/2005   Tdap 04/27/2011, 09/20/2021   Unspecified SARS-COV-2 Vaccination 03/31/2023, 04/28/2023, 11/04/2023, 05/25/2024   Zoster, Live 08/03/2011   Pertinent  Health Maintenance Due  Topic Date Due   Influenza Vaccine  Completed   Bone Density Scan  Discontinued      05/25/2024    9:14 AM 05/27/2024   10:49 AM 07/23/2024    1:55 PM 07/23/2024    2:27 PM 08/13/2024    1:46 PM  Fall Risk  Falls in the past year? 0 0 0 0 1  Was there an injury with Fall? 0  0  0 0 1  Fall Risk Category Calculator 0 0 0 0 3  Patient at Risk for Falls Due to Impaired balance/gait Impaired balance/gait Impaired vision History of fall(s);Impaired vision Impaired balance/gait  Fall risk Follow up Falls evaluation completed Falls evaluation completed Falls evaluation completed Falls evaluation completed Falls evaluation completed     Data saved with a previous flowsheet row definition   Functional Status Survey:    Vitals:   08/13/24 1345  BP: 138/88  Pulse: 79  Temp: 97.9 F (36.6 C)   SpO2: 97%  Weight: 167 lb (75.8 kg)  Height: 4' 10 (1.473 m)   Body mass index is 34.9 kg/m. Physical Exam Vitals reviewed.  Constitutional:      General: She is not in acute distress. HENT:     Head: Normocephalic.  Eyes:     General:        Right eye: No discharge.        Left eye: No discharge.  Cardiovascular:     Rate and Rhythm: Normal rate and regular rhythm.     Pulses: Normal pulses.     Heart sounds: Normal heart sounds.  Pulmonary:     Effort: Pulmonary effort is normal.     Breath sounds: Normal breath sounds.  Abdominal:  Palpations: Abdomen is soft.  Musculoskeletal:     Cervical back: Neck supple.     Right lower leg: No edema.     Left lower leg: No edema.  Skin:    Capillary Refill: Capillary refill takes less than 2 seconds.     Comments: Approx 3-4 cm open wound (linear) to left shin, erythema, swelling, tenderness present, drainage appears to be serosanguinous, no odor  Neurological:     General: No focal deficit present.     Mental Status: She is alert and oriented to person, place, and time.     Gait: Gait normal.  Psychiatric:        Mood and Affect: Mood normal.     Labs reviewed: Recent Labs    04/05/24 0759  NA 140  K 4.0  CL 102  CO2 30  GLUCOSE 88  BUN 17  CREATININE 0.82  CALCIUM  8.8   Recent Labs    04/05/24 0759  AST 16  ALT 12  BILITOT 0.7  PROT 6.7   Recent Labs    04/05/24 0759  WBC 5.9  NEUTROABS 3,487  HGB 14.4  HCT 44.8  MCV 99.8  PLT 194   Lab Results  Component Value Date   TSH 2.13 05/02/2009   No results found for: HGBA1C Lab Results  Component Value Date   CHOL 179 04/05/2024   HDL 52 04/05/2024   LDLCALC 102 (H) 04/05/2024   LDLDIRECT 155.2 05/02/2009   TRIG 151 (H) 04/05/2024   CHOLHDL 3.4 04/05/2024    Significant Diagnostic Results in last 30 days:  No results found.  Assessment/Plan 1. Cellulitis of left lower leg (Primary) - DOI 01/15> fell out of bed  - wound to left  shin> erythema/swelling/tenderness present  - start doxy and florastor - recommend [place] wound visits 2x/week x 2 weeks  - doxycycline  (VIBRA -TABS) 100 MG tablet; Take 1 tablet (100 mg total) by mouth 2 (two) times daily for 10 days.  Dispense: 20 tablet; Refill: 0 - saccharomyces boulardii (FLORASTOR) 250 MG capsule; Take 1 capsule (250 mg total) by mouth 2 (two) times daily for 10 days.  Dispense: 20 capsule; Refill: 0  2. Fall, initial encounter - see above - falls safety discussed    Family/ staff Communication: plan discussed with patient and nurse  Labs/tests ordered:  none      [1] No Known Allergies  "

## 2025-02-01 ENCOUNTER — Encounter: Admitting: Orthopedic Surgery
